# Patient Record
Sex: Female | Born: 1955
Health system: Southern US, Community
[De-identification: ages and names within clinical notes are randomized; demographics above are authoritative.]

## PROBLEM LIST (undated history)

## (undated) DIAGNOSIS — I499 Cardiac arrhythmia, unspecified: Secondary | ICD-10-CM

## (undated) DIAGNOSIS — E785 Hyperlipidemia, unspecified: Secondary | ICD-10-CM

## (undated) DIAGNOSIS — K635 Polyp of colon: Secondary | ICD-10-CM

## (undated) DIAGNOSIS — R5383 Other fatigue: Secondary | ICD-10-CM

## (undated) DIAGNOSIS — G932 Benign intracranial hypertension: Secondary | ICD-10-CM

## (undated) DIAGNOSIS — R079 Chest pain, unspecified: Secondary | ICD-10-CM

## (undated) DIAGNOSIS — F419 Anxiety disorder, unspecified: Secondary | ICD-10-CM

## (undated) DIAGNOSIS — M549 Dorsalgia, unspecified: Secondary | ICD-10-CM

## (undated) DIAGNOSIS — I10 Essential (primary) hypertension: Secondary | ICD-10-CM

## (undated) DIAGNOSIS — M1990 Unspecified osteoarthritis, unspecified site: Secondary | ICD-10-CM

## (undated) DIAGNOSIS — H93A3 Pulsatile tinnitus, bilateral: Secondary | ICD-10-CM

## (undated) HISTORY — DX: Anxiety disorder, unspecified: F41.9

## (undated) HISTORY — DX: Pulsatile tinnitus, bilateral: H93.A3

## (undated) HISTORY — DX: Benign intracranial hypertension: G93.2

## (undated) HISTORY — DX: Essential (primary) hypertension: I10

## (undated) HISTORY — PX: WISDOM TOOTH EXTRACTION: SHX21

## (undated) HISTORY — DX: Hyperlipidemia, unspecified: E78.5

## (undated) HISTORY — DX: Cardiac arrhythmia, unspecified: I49.9

## (undated) HISTORY — DX: Dorsalgia, unspecified: M54.9

## (undated) HISTORY — PX: COLONOSCOPY: SHX174

## (undated) HISTORY — DX: Unspecified osteoarthritis, unspecified site: M19.90

## (undated) HISTORY — DX: Chest pain, unspecified: R07.9

## (undated) HISTORY — DX: Polyp of colon: K63.5

## (undated) HISTORY — DX: Other fatigue: R53.83

---

## 1998-04-08 ENCOUNTER — Ambulatory Visit (HOSPITAL_COMMUNITY): Admission: RE | Admit: 1998-04-08 | Discharge: 1998-04-08 | Payer: Self-pay | Admitting: Internal Medicine

## 1998-04-08 ENCOUNTER — Encounter: Payer: Self-pay | Admitting: Internal Medicine

## 1998-04-12 ENCOUNTER — Other Ambulatory Visit: Admission: RE | Admit: 1998-04-12 | Discharge: 1998-04-12 | Payer: Self-pay | Admitting: Internal Medicine

## 1998-09-07 ENCOUNTER — Other Ambulatory Visit: Admission: RE | Admit: 1998-09-07 | Discharge: 1998-09-07 | Payer: Self-pay | Admitting: Gynecology

## 1999-08-31 ENCOUNTER — Other Ambulatory Visit: Admission: RE | Admit: 1999-08-31 | Discharge: 1999-08-31 | Payer: Self-pay | Admitting: Gynecology

## 2000-03-05 ENCOUNTER — Ambulatory Visit (HOSPITAL_BASED_OUTPATIENT_CLINIC_OR_DEPARTMENT_OTHER): Admission: RE | Admit: 2000-03-05 | Discharge: 2000-03-05 | Payer: Self-pay | Admitting: Orthopedic Surgery

## 2000-12-10 ENCOUNTER — Encounter: Payer: Self-pay | Admitting: Internal Medicine

## 2000-12-10 ENCOUNTER — Ambulatory Visit (HOSPITAL_COMMUNITY): Admission: RE | Admit: 2000-12-10 | Discharge: 2000-12-10 | Payer: Self-pay | Admitting: Internal Medicine

## 2001-02-12 ENCOUNTER — Other Ambulatory Visit: Admission: RE | Admit: 2001-02-12 | Discharge: 2001-02-12 | Payer: Self-pay | Admitting: Gynecology

## 2002-02-09 ENCOUNTER — Other Ambulatory Visit: Admission: RE | Admit: 2002-02-09 | Discharge: 2002-02-09 | Payer: Self-pay | Admitting: Gynecology

## 2003-09-27 ENCOUNTER — Ambulatory Visit (HOSPITAL_COMMUNITY): Admission: RE | Admit: 2003-09-27 | Discharge: 2003-09-27 | Payer: Self-pay | Admitting: Internal Medicine

## 2003-10-01 ENCOUNTER — Ambulatory Visit (HOSPITAL_COMMUNITY): Admission: RE | Admit: 2003-10-01 | Discharge: 2003-10-01 | Payer: Self-pay | Admitting: Internal Medicine

## 2004-07-27 ENCOUNTER — Ambulatory Visit: Payer: Self-pay | Admitting: Internal Medicine

## 2004-08-14 ENCOUNTER — Other Ambulatory Visit: Admission: RE | Admit: 2004-08-14 | Discharge: 2004-08-14 | Payer: Self-pay | Admitting: Gynecology

## 2005-02-03 ENCOUNTER — Emergency Department (HOSPITAL_COMMUNITY): Admission: EM | Admit: 2005-02-03 | Discharge: 2005-02-03 | Payer: Self-pay | Admitting: Emergency Medicine

## 2005-02-15 ENCOUNTER — Encounter: Admission: RE | Admit: 2005-02-15 | Discharge: 2005-02-15 | Payer: Self-pay | Admitting: Neurology

## 2005-09-27 ENCOUNTER — Emergency Department (HOSPITAL_COMMUNITY): Admission: EM | Admit: 2005-09-27 | Discharge: 2005-09-27 | Payer: Self-pay | Admitting: Emergency Medicine

## 2006-03-20 ENCOUNTER — Encounter: Admission: RE | Admit: 2006-03-20 | Discharge: 2006-03-20 | Payer: Self-pay | Admitting: Neurology

## 2007-03-04 ENCOUNTER — Ambulatory Visit: Payer: Self-pay | Admitting: Internal Medicine

## 2007-03-11 ENCOUNTER — Ambulatory Visit: Payer: Self-pay | Admitting: Internal Medicine

## 2008-01-09 ENCOUNTER — Emergency Department (HOSPITAL_COMMUNITY): Admission: EM | Admit: 2008-01-09 | Discharge: 2008-01-10 | Payer: Self-pay | Admitting: Emergency Medicine

## 2009-09-09 ENCOUNTER — Encounter: Admission: RE | Admit: 2009-09-09 | Discharge: 2009-09-09 | Payer: Self-pay | Admitting: Family Medicine

## 2009-09-20 ENCOUNTER — Encounter: Admission: RE | Admit: 2009-09-20 | Discharge: 2009-09-20 | Payer: Self-pay | Admitting: Family Medicine

## 2010-07-14 NOTE — Op Note (Signed)
[facility]  Patient:    [name], Nicole Cantu                         MRN: [id number] Proc. Date: 03/05/00 Attending:  Elana Alm. Thurston Hole, M.D.                           Operative Report  PREOPERATIVE DIAGNOSIS:  Left knee medial meniscus tear.  POSTOPERATIVE DIAGNOSIS: 1. Left knee medial meniscus tear. 2. Left knee medial compartment and patellofemoral grade 3 chondromalacia.  OPERATION PERFORMED: 1. Left knee examination under anesthesia followed by arthroscopic partial    medial meniscectomy. 2. Left knee chondroplasty.  SURGEON:  Elana Alm. Thurston Hole, M.D.  ASSISTANT:  Gifford Shave, PA  ANESTHESIA:  Local and MAC.  OPERATIVE TIME:  30 minutes.  COMPLICATIONS:  None.  INDICATIONS FOR PROCEDURE:  [name] is a 55 year old woman who has had three years of increasing left knee pain with loss of full motion and intermittent catching and popping, signs and symptoms consistent with medial meniscus tear, who has failed conservative care and is now to undergo arthroscopy.  DESCRIPTION OF PROCEDURE:  The patient was brought to the operating room on March 05, 2000 after a block had been placed in the holding room.  Placed on the operating table in supine position.  Her left knee was examined under anesthesia.  Range of motion from 0 to 125 degrees, 1+ crepitation.  Knee stable to ligamentous exam with normal patellar tracking.  After this was done the leg was prepped using sterile Betadine and draped using sterile technique. Originally through an inferolateral portal, the arthroscope with a pump attached was placed and through an inferomedial portal, an arthroscopic probe was placed.  On initial inspection of the medial compartment she was found to have 20% grade 3 chondromalacia, medial femoral condyle, medial tibial plateau which was debrided.  Medial meniscus was probed and she was found to have split complex tear of the posterior medial horn  of which 40% was resected back to a stable rim.  The intercondylar notch was inspected.  The anterior and posterior cruciate ligaments were normal.  The lateral compartment inspected. Articular cartilage, lateral femoral condyle and lateral tibial plateau was intact.  Lateral meniscus was probed and this was found to be normal.  The patellofemoral joint was inspected.  She had grade 3 chondromalacia over 70% of the patella which was thoroughly debrided.  Femoral groove showed only mild grade 1 to 2 changes.  Patella tracked normally.  Moderate synovitis in the medial and lateral gutters was debrided.  Otherwise they were free of pathology.  After this was done, it was felt that all pathology had been satisfactorily addressed.  The instruments were removed.  Portals were closed with 3-0 nylon suture and injected with 0.25% Marcaine with epinephrine and 5 mg of morphine.  Sterile dressing applied.  The patient was awakened and taken to the recovery room in stable condition.  FOLLOW-UP:  [name] is to be followed as an outpatient on Vicodin and Vioxx.  See her back in the office in a week for suture removal and follow-up. D:  03/05/00 TD:  03/05/00 Job: [id number]

## 2010-11-28 LAB — POCT CARDIAC MARKERS
CKMB, poc: <1 — ABNORMAL LOW
CKMB, poc: <1 — ABNORMAL LOW
Myoglobin, poc: 31.9
Myoglobin, poc: 34.1
Troponin i, poc: <0.05
Troponin i, poc: <0.05

## 2010-11-28 LAB — D-DIMER, QUANTITATIVE: D-Dimer, Quant: 0.61 — ABNORMAL HIGH

## 2010-11-28 LAB — COMPREHENSIVE METABOLIC PANEL
ALT: 27
AST: 36
Albumin: 4.2
Alkaline Phosphatase: 33 — ABNORMAL LOW
BUN: 7
CO2: 29
Calcium: 9.6
Chloride: 106
Creatinine, Ser: 0.77
GFR calc Af Amer: >60
GFR calc non Af Amer: >60
Glucose, Bld: 140 — ABNORMAL HIGH
Potassium: 3.7
Sodium: 142
Total Bilirubin: 0.7
Total Protein: 6.3

## 2010-11-28 LAB — LIPASE, BLOOD: Lipase: 21

## 2010-11-28 LAB — CBC
HCT: 39.8
Hemoglobin: 13.2
MCHC: 33.2
MCV: 94.1
Platelets: 210
RBC: 4.23
RDW: 13.2
WBC: 6.1

## 2010-11-28 LAB — DIFFERENTIAL
Basophils Absolute: 0
Basophils Relative: 0
Eosinophils Absolute: 0.2
Eosinophils Relative: 3
Lymphocytes Relative: 32
Lymphs Abs: 1.9
Monocytes Absolute: 0.4
Monocytes Relative: 7
Neutro Abs: 3.5
Neutrophils Relative %: 58

## 2011-09-18 ENCOUNTER — Other Ambulatory Visit: Payer: Self-pay | Admitting: Family Medicine

## 2011-09-18 DIAGNOSIS — R221 Localized swelling, mass and lump, neck: Secondary | ICD-10-CM

## 2011-09-19 ENCOUNTER — Ambulatory Visit
Admission: RE | Admit: 2011-09-19 | Discharge: 2011-09-19 | Disposition: A | Discharge status: Home / Self Care | Payer: 59 | Source: Ambulatory Visit | Attending: Family Medicine | Admitting: Family Medicine

## 2011-09-19 DIAGNOSIS — R221 Localized swelling, mass and lump, neck: Secondary | ICD-10-CM

## 2012-02-29 ENCOUNTER — Other Ambulatory Visit: Payer: Self-pay | Admitting: Family Medicine

## 2012-02-29 DIAGNOSIS — E042 Nontoxic multinodular goiter: Secondary | ICD-10-CM

## 2012-03-03 ENCOUNTER — Ambulatory Visit
Admission: RE | Admit: 2012-03-03 | Discharge: 2012-03-03 | Disposition: A | Discharge status: Home / Self Care | Payer: 59 | Source: Ambulatory Visit | Attending: Family Medicine | Admitting: Family Medicine

## 2012-03-03 DIAGNOSIS — E042 Nontoxic multinodular goiter: Secondary | ICD-10-CM

## 2012-03-21 ENCOUNTER — Ambulatory Visit
Admission: RE | Admit: 2012-03-21 | Discharge: 2012-03-21 | Disposition: A | Discharge status: Home / Self Care | Payer: 59 | Source: Ambulatory Visit | Attending: Family Medicine | Admitting: Family Medicine

## 2012-03-21 ENCOUNTER — Other Ambulatory Visit: Payer: Self-pay | Admitting: Family Medicine

## 2012-03-21 DIAGNOSIS — R52 Pain, unspecified: Secondary | ICD-10-CM

## 2012-12-02 ENCOUNTER — Ambulatory Visit: Payer: 59 | Attending: Family Medicine

## 2012-12-02 DIAGNOSIS — M25659 Stiffness of unspecified hip, not elsewhere classified: Secondary | ICD-10-CM | POA: Insufficient documentation

## 2012-12-02 DIAGNOSIS — R5381 Other malaise: Secondary | ICD-10-CM | POA: Insufficient documentation

## 2012-12-02 DIAGNOSIS — M545 Low back pain, unspecified: Secondary | ICD-10-CM | POA: Insufficient documentation

## 2012-12-02 DIAGNOSIS — IMO0001 Reserved for inherently not codable concepts without codable children: Secondary | ICD-10-CM | POA: Insufficient documentation

## 2012-12-03 ENCOUNTER — Ambulatory Visit: Payer: 59 | Admitting: Physical Therapy

## 2012-12-08 ENCOUNTER — Ambulatory Visit: Payer: 59 | Admitting: Physical Therapy

## 2012-12-10 ENCOUNTER — Ambulatory Visit: Payer: 59 | Admitting: Physical Therapy

## 2012-12-15 ENCOUNTER — Ambulatory Visit: Payer: 59 | Admitting: Physical Therapy

## 2012-12-17 ENCOUNTER — Ambulatory Visit: Payer: 59

## 2012-12-22 ENCOUNTER — Ambulatory Visit: Payer: 59 | Admitting: Physical Therapy

## 2012-12-24 ENCOUNTER — Ambulatory Visit: Payer: 59 | Admitting: Physical Therapy

## 2012-12-29 ENCOUNTER — Ambulatory Visit: Payer: 59 | Attending: Family Medicine | Admitting: Physical Therapy

## 2012-12-29 DIAGNOSIS — M545 Low back pain, unspecified: Secondary | ICD-10-CM | POA: Insufficient documentation

## 2012-12-29 DIAGNOSIS — M25659 Stiffness of unspecified hip, not elsewhere classified: Secondary | ICD-10-CM | POA: Insufficient documentation

## 2012-12-29 DIAGNOSIS — IMO0001 Reserved for inherently not codable concepts without codable children: Secondary | ICD-10-CM | POA: Insufficient documentation

## 2012-12-29 DIAGNOSIS — R5381 Other malaise: Secondary | ICD-10-CM | POA: Insufficient documentation

## 2012-12-31 ENCOUNTER — Ambulatory Visit: Payer: 59 | Admitting: Physical Therapy

## 2013-01-07 ENCOUNTER — Ambulatory Visit: Payer: 59 | Admitting: Physical Therapy

## 2013-01-09 ENCOUNTER — Ambulatory Visit: Payer: 59 | Admitting: Physical Therapy

## 2013-01-12 ENCOUNTER — Ambulatory Visit: Payer: 59 | Admitting: Physical Therapy

## 2013-01-14 ENCOUNTER — Ambulatory Visit: Payer: 59 | Admitting: Physical Therapy

## 2013-01-19 ENCOUNTER — Ambulatory Visit: Payer: 59

## 2013-11-27 ENCOUNTER — Encounter: Payer: Self-pay | Admitting: Internal Medicine

## 2014-01-11 ENCOUNTER — Other Ambulatory Visit: Payer: Self-pay

## 2014-01-12 LAB — CYTOLOGY - PAP

## 2016-04-20 DIAGNOSIS — H01001 Unspecified blepharitis right upper eyelid: Secondary | ICD-10-CM | POA: Diagnosis not present

## 2016-04-20 DIAGNOSIS — H01004 Unspecified blepharitis left upper eyelid: Secondary | ICD-10-CM | POA: Diagnosis not present

## 2016-11-23 DIAGNOSIS — Z1231 Encounter for screening mammogram for malignant neoplasm of breast: Secondary | ICD-10-CM | POA: Diagnosis not present

## 2016-11-23 DIAGNOSIS — Z803 Family history of malignant neoplasm of breast: Secondary | ICD-10-CM | POA: Diagnosis not present

## 2017-01-01 DIAGNOSIS — Z0000 Encounter for general adult medical examination without abnormal findings: Secondary | ICD-10-CM | POA: Diagnosis not present

## 2017-01-01 DIAGNOSIS — E785 Hyperlipidemia, unspecified: Secondary | ICD-10-CM | POA: Diagnosis not present

## 2017-01-01 DIAGNOSIS — Z23 Encounter for immunization: Secondary | ICD-10-CM | POA: Diagnosis not present

## 2017-03-05 ENCOUNTER — Ambulatory Visit
Admission: RE | Admit: 2017-03-05 | Discharge: 2017-03-05 | Disposition: A | Discharge status: Home / Self Care | Payer: 59 | Source: Ambulatory Visit | Attending: Family Medicine | Admitting: Family Medicine

## 2017-03-05 ENCOUNTER — Other Ambulatory Visit: Payer: Self-pay | Admitting: Family Medicine

## 2017-03-05 DIAGNOSIS — M25561 Pain in right knee: Secondary | ICD-10-CM

## 2017-03-05 DIAGNOSIS — S8991XA Unspecified injury of right lower leg, initial encounter: Secondary | ICD-10-CM | POA: Diagnosis not present

## 2017-03-05 DIAGNOSIS — M7989 Other specified soft tissue disorders: Secondary | ICD-10-CM | POA: Diagnosis not present

## 2017-04-03 ENCOUNTER — Encounter: Payer: Self-pay | Admitting: Gastroenterology

## 2017-04-24 DIAGNOSIS — H01001 Unspecified blepharitis right upper eyelid: Secondary | ICD-10-CM | POA: Diagnosis not present

## 2017-05-16 ENCOUNTER — Encounter: Payer: Self-pay | Admitting: Gastroenterology

## 2017-06-08 DIAGNOSIS — Z23 Encounter for immunization: Secondary | ICD-10-CM | POA: Diagnosis not present

## 2017-06-25 ENCOUNTER — Other Ambulatory Visit: Payer: Self-pay

## 2017-06-25 ENCOUNTER — Ambulatory Visit (AMBULATORY_SURGERY_CENTER): Payer: Self-pay

## 2017-06-25 VITALS — Ht 64.0 in | Wt 157.8 lb

## 2017-06-25 DIAGNOSIS — Z1211 Encounter for screening for malignant neoplasm of colon: Secondary | ICD-10-CM

## 2017-06-25 MED ORDER — NA SULFATE-K SULFATE-MG SULF 17.5-3.13-1.6 GM/177ML PO SOLN: 1.0000 | Freq: Once | ORAL | 0 refills | Status: AC

## 2017-06-25 NOTE — Progress Notes (Signed)
Denies allergies to eggs or soy products. Denies complication of anesthesia or sedation. Denies use of weight loss medication. Denies use of O2.   Emmi instructions declined.  

## 2017-06-28 ENCOUNTER — Encounter: Payer: Self-pay | Admitting: Gastroenterology

## 2017-07-05 ENCOUNTER — Telehealth: Payer: Self-pay | Admitting: Gastroenterology

## 2017-07-05 DIAGNOSIS — J209 Acute bronchitis, unspecified: Secondary | ICD-10-CM | POA: Diagnosis not present

## 2017-07-05 DIAGNOSIS — R05 Cough: Secondary | ICD-10-CM | POA: Diagnosis not present

## 2017-07-05 DIAGNOSIS — B379 Candidiasis, unspecified: Secondary | ICD-10-CM | POA: Diagnosis not present

## 2017-07-05 NOTE — Telephone Encounter (Signed)
Husband states saw urgent care today and she has been dx'd with bronchitis- she got 4 scripts today, one antibiotic and was wondering about colon thursdya 5-16. Pt's husband informed as lomg as no fever and her s/s do not get worse she can proceed with colon as scheduled 5-16.  If she worsens, runs fever she may need to cancel. She can continue abx as directed even with colon 5-16.  Cal if she worsens and needs to cancel by Tuesday  07-09-17.  Call with further questions.  Lelan Pons PV

## 2017-07-09 ENCOUNTER — Telehealth: Payer: Self-pay

## 2017-07-09 NOTE — Telephone Encounter (Signed)
Pt's husband calling wanting to speak with nurse again wants to verify it is okay for pt to have colonoscopy on 07/11/17. Best call back [phone].

## 2017-07-09 NOTE — Telephone Encounter (Signed)
Return pts husband telephone call. Pt husband verbalize pt is feeling better. No s/s of fever. Pt takes last dose of antibiotic tomorrow. Advised it is okay to proceed with colonoscopy as scheduled.

## 2017-07-11 ENCOUNTER — Other Ambulatory Visit: Payer: Self-pay

## 2017-07-11 ENCOUNTER — Ambulatory Visit (AMBULATORY_SURGERY_CENTER): Payer: 59 | Admitting: Gastroenterology

## 2017-07-11 ENCOUNTER — Encounter: Payer: Self-pay | Admitting: Gastroenterology

## 2017-07-11 VITALS — BP 111/62 | HR 59 | Temp 97.8°F | Resp 11 | Ht 64.0 in | Wt 157.0 lb

## 2017-07-11 DIAGNOSIS — D123 Benign neoplasm of transverse colon: Secondary | ICD-10-CM | POA: Diagnosis not present

## 2017-07-11 DIAGNOSIS — D122 Benign neoplasm of ascending colon: Secondary | ICD-10-CM

## 2017-07-11 DIAGNOSIS — Z1211 Encounter for screening for malignant neoplasm of colon: Secondary | ICD-10-CM | POA: Diagnosis present

## 2017-07-11 MED ORDER — SODIUM CHLORIDE 0.9 % IV SOLN: 500.0000 mL | Freq: Once | INTRAVENOUS | Status: DC

## 2017-07-11 NOTE — Progress Notes (Signed)
Called to room to assist during endoscopic procedure.  Patient ID and intended procedure confirmed with present staff. Received instructions for my participation in the procedure from the performing physician.  

## 2017-07-11 NOTE — Progress Notes (Signed)
Pt just getting over Bronchitis, finish last dose of antibiotic on Tuesday.

## 2017-07-11 NOTE — Progress Notes (Signed)
Report to PACU, RN, vss, BBS= Clear.  

## 2017-07-11 NOTE — Progress Notes (Signed)
Pt's states no medical or surgical changes since previsit or office visit.Pt's states no medical or surgical changes since previsit or office visit. 

## 2017-07-11 NOTE — Patient Instructions (Signed)
**   Handouts given on polyps and hemorrhoids  YOU HAD AN ENDOSCOPIC PROCEDURE TODAY AT THE [facility]:   Refer to the procedure report that was given to you for any specific questions about what was found during the examination.  If the procedure report does not answer your questions, please call your gastroenterologist to clarify.  If you requested that your care partner not be given the details of your procedure findings, then the procedure report has been included in a sealed envelope for you to review at your convenience later.  YOU SHOULD EXPECT: Some feelings of bloating in the abdomen. Passage of more gas than usual.  Walking can help get rid of the air that was put into your GI tract during the procedure and reduce the bloating. If you had a lower endoscopy (such as a colonoscopy or flexible sigmoidoscopy) you may notice spotting of blood in your stool or on the toilet paper. If you underwent a bowel prep for your procedure, you may not have a normal bowel movement for a few days.  Please Note:  You might notice some irritation and congestion in your nose or some drainage.  This is from the oxygen used during your procedure.  There is no need for concern and it should clear up in a day or so.  SYMPTOMS TO REPORT IMMEDIATELY:   Following lower endoscopy (colonoscopy or flexible sigmoidoscopy):  Excessive amounts of blood in the stool  Significant tenderness or worsening of abdominal pains  Swelling of the abdomen that is new, acute  Fever of 100F or higher   For urgent or emergent issues, a gastroenterologist can be reached at any hour by calling [phone].   DIET:  We do recommend a small meal at first, but then you may proceed to your regular diet.  Drink plenty of fluids but you should avoid alcoholic beverages for 24 hours.  ACTIVITY:  You should plan to take it easy for the rest of today and you should NOT DRIVE or use heavy machinery until tomorrow (because of the  sedation medicines used during the test).    FOLLOW UP: Our staff will call the number listed on your records the next business day following your procedure to check on you and address any questions or concerns that you may have regarding the information given to you following your procedure. If we do not reach you, we will leave a message.  However, if you are feeling well and you are not experiencing any problems, there is no need to return our call.  We will assume that you have returned to your regular daily activities without incident.  If any biopsies were taken you will be contacted by phone or by letter within the next 1-3 weeks.  Please call us at [phone] if you have not heard about the biopsies in 3 weeks.    SIGNATURES/CONFIDENTIALITY: You and/or your care partner have signed paperwork which will be entered into your electronic medical record.  These signatures attest to the fact that that the information above on your After Visit Summary has been reviewed and is understood.  Full responsibility of the confidentiality of this discharge information lies with you and/or your care-partner. 

## 2017-07-11 NOTE — Op Note (Signed)
[facility] Patient Name: Nicole Cantu Procedure Date: 07/11/2017 8:03 AM MRN: [id number] Endoscopist: Mauri Pole , MD Age: 62 Referring MD:  Date of Birth: 11-29-1955 Gender: Female Account #: [id number] Procedure:                Colonoscopy Indications:              Screening for colorectal malignant neoplasm, Last                            colonoscopy: 2009 Medicines:                Monitored Anesthesia Care Procedure:                Pre-Anesthesia Assessment:                           - Prior to the procedure, a History and Physical                            was performed, and patient medications and                            allergies were reviewed. The patient's tolerance of                            previous anesthesia was also reviewed. The risks                            and benefits of the procedure and the sedation                            options and risks were discussed with the patient.                            All questions were answered, and informed consent                            was obtained. Prior Anticoagulants: The patient has                            taken no previous anticoagulant or antiplatelet                            agents. ASA Grade Assessment: II - A patient with                            mild systemic disease. After reviewing the risks                            and benefits, the patient was deemed in                            satisfactory condition to undergo the procedure.  After obtaining informed consent, the colonoscope                            was passed under direct vision. Throughout the                            procedure, the patient's blood pressure, pulse, and                            oxygen saturations were monitored continuously. The                            Model PCF-[id number]) scope was introduced                            through the anus and advanced to the  the cecum,                            identified by appendiceal orifice and ileocecal                            valve. The colonoscopy was performed without                            difficulty. The patient tolerated the procedure                            well. The quality of the bowel preparation was                            excellent. The ileocecal valve, appendiceal                            orifice, and rectum were photographed. Scope In: 8:06:08 AM Scope Out: 8:18:01 AM Scope Withdrawal Time: 0 hours 9 minutes 38 seconds  Total Procedure Duration: 0 hours 11 minutes 53 seconds  Findings:                 The perianal and digital rectal examinations were                            normal.                           A 2 mm polyp was found in the transverse colon. The                            polyp was sessile. The polyp was removed with a                            cold biopsy forceps. Resection and retrieval were                            complete.  Two sessile polyps were found in the transverse                            colon and ascending colon. The polyps were 4 to 8                            mm in size. These polyps were removed with a cold                            snare. Resection and retrieval were complete.                           Non-bleeding internal hemorrhoids were found during                            retroflexion. The hemorrhoids were large.                           The exam was otherwise without abnormality. Complications:            No immediate complications. Estimated Blood Loss:     Estimated blood loss was minimal. Impression:               - One 2 mm polyp in the transverse colon, removed                            with a cold biopsy forceps. Resected and retrieved.                           - Two 4 to 8 mm polyps in the transverse colon and                            in the ascending colon, removed with a cold snare.                             Resected and retrieved.                           - Non-bleeding internal hemorrhoids.                           - The examination was otherwise normal. Recommendation:           - Patient has a contact number available for                            emergencies. The signs and symptoms of potential                            delayed complications were discussed with the                            patient. Return to normal activities tomorrow.  Written discharge instructions were provided to the                            patient.                           - Resume previous diet.                           - Continue present medications.                           - Await pathology results.                           - Repeat colonoscopy in 3 - 5 years for                            surveillance based on pathology results. Mauri Pole, MD 07/11/2017 8:24:14 AM This report has been signed electronically.

## 2017-07-12 ENCOUNTER — Telehealth: Payer: Self-pay | Admitting: *Deleted

## 2017-07-12 NOTE — Telephone Encounter (Signed)
  Follow up Call-  Call back number 07/11/2017  Post procedure Call Back phone  # [phone]  Permission to leave phone message Yes  Some recent data might be hidden     Patient questions:  Do you have a fever, pain , or abdominal swelling? No. Pain Score  0 *  Have you tolerated food without any problems? Yes.    Have you been able to return to your normal activities? Yes.    Do you have any questions about your discharge instructions: Diet   No. Medications  No. Follow up visit  No.  Do you have questions or concerns about your Care? No.  Actions: * If pain score is 4 or above: No action needed, pain <4.

## 2017-07-23 ENCOUNTER — Encounter: Payer: Self-pay | Admitting: Gastroenterology

## 2017-07-24 DIAGNOSIS — Q825 Congenital non-neoplastic nevus: Secondary | ICD-10-CM | POA: Diagnosis not present

## 2017-07-24 DIAGNOSIS — L918 Other hypertrophic disorders of the skin: Secondary | ICD-10-CM | POA: Diagnosis not present

## 2017-07-24 DIAGNOSIS — L309 Dermatitis, unspecified: Secondary | ICD-10-CM | POA: Diagnosis not present

## 2017-08-08 DIAGNOSIS — Z23 Encounter for immunization: Secondary | ICD-10-CM | POA: Diagnosis not present

## 2017-08-27 DIAGNOSIS — E785 Hyperlipidemia, unspecified: Secondary | ICD-10-CM | POA: Diagnosis not present

## 2017-11-15 DIAGNOSIS — Z23 Encounter for immunization: Secondary | ICD-10-CM | POA: Diagnosis not present

## 2017-11-25 DIAGNOSIS — Z1231 Encounter for screening mammogram for malignant neoplasm of breast: Secondary | ICD-10-CM | POA: Diagnosis not present

## 2017-12-17 ENCOUNTER — Emergency Department (HOSPITAL_COMMUNITY)
Admission: EM | Admit: 2017-12-17 | Discharge: 2017-12-17 | Disposition: A | Discharge status: Home / Self Care | Payer: 59 | Attending: Emergency Medicine | Admitting: Emergency Medicine

## 2017-12-17 ENCOUNTER — Encounter (HOSPITAL_COMMUNITY): Payer: Self-pay

## 2017-12-17 ENCOUNTER — Emergency Department (HOSPITAL_COMMUNITY): Payer: 59

## 2017-12-17 ENCOUNTER — Other Ambulatory Visit: Payer: Self-pay

## 2017-12-17 DIAGNOSIS — Z79899 Other long term (current) drug therapy: Secondary | ICD-10-CM | POA: Insufficient documentation

## 2017-12-17 DIAGNOSIS — M542 Cervicalgia: Secondary | ICD-10-CM | POA: Diagnosis not present

## 2017-12-17 DIAGNOSIS — I10 Essential (primary) hypertension: Secondary | ICD-10-CM | POA: Diagnosis not present

## 2017-12-17 DIAGNOSIS — G9389 Other specified disorders of brain: Secondary | ICD-10-CM | POA: Diagnosis not present

## 2017-12-17 DIAGNOSIS — R220 Localized swelling, mass and lump, head: Secondary | ICD-10-CM | POA: Diagnosis not present

## 2017-12-17 DIAGNOSIS — I6523 Occlusion and stenosis of bilateral carotid arteries: Secondary | ICD-10-CM | POA: Diagnosis not present

## 2017-12-17 LAB — BASIC METABOLIC PANEL
Anion gap: 7 (ref 5–15)
BUN: 11 mg/dL (ref 6–20)
CO2: 28 mmol/L (ref 22–32)
Calcium: 9.7 mg/dL (ref 8.9–10.3)
Chloride: 104 mmol/L (ref 98–111)
Creatinine, Ser: 0.89 mg/dL (ref 0.44–1.00)
GFR calc Af Amer: >60 mL/min (ref >60)
GFR calc non Af Amer: >60 mL/min (ref >60)
Glucose, Bld: 109 mg/dL — ABNORMAL HIGH (ref 70–99)
Potassium: 4.8 mmol/L (ref 3.5–5.1)
Sodium: 139 mmol/L (ref 135–145)

## 2017-12-17 LAB — CBC WITH DIFFERENTIAL/PLATELET
Abs Immature Granulocytes: 0.05 10*3/uL (ref 0.00–0.07)
Basophils Absolute: 0.1 10*3/uL (ref 0.0–0.1)
Basophils Relative: 1 %
Eosinophils Absolute: 0.2 10*3/uL (ref 0.0–0.5)
Eosinophils Relative: 2 %
HCT: 44 % (ref 36.0–46.0)
Hemoglobin: 14.4 g/dL (ref 12.0–15.0)
Immature Granulocytes: 1 %
Lymphocytes Relative: 25 %
Lymphs Abs: 2.3 10*3/uL (ref 0.7–4.0)
MCH: 30.6 pg (ref 26.0–34.0)
MCHC: 32.7 g/dL (ref 30.0–36.0)
MCV: 93.4 fL (ref 80.0–100.0)
Monocytes Absolute: 0.7 10*3/uL (ref 0.1–1.0)
Monocytes Relative: 8 %
Neutro Abs: 5.8 10*3/uL (ref 1.7–7.7)
Neutrophils Relative %: 63 %
Platelets: 238 10*3/uL (ref 150–400)
RBC: 4.71 MIL/uL (ref 3.87–5.11)
RDW: 12.6 % (ref 11.5–15.5)
WBC: 9.1 10*3/uL (ref 4.0–10.5)
nRBC: 0 % (ref 0.0–0.2)

## 2017-12-17 MED ORDER — IOPAMIDOL (ISOVUE-370) INJECTION 76%
INTRAVENOUS | Status: AC
  Administered 2017-12-17: 50 mL
  Filled 2017-12-17: qty 50

## 2017-12-17 NOTE — ED Triage Notes (Signed)
Pt states approximately 1 hour ago she had excruciating pain in the right side of her jaw radiating into her head, she reports she now has facial swelling and some numbness. Pt also endorses some dizziness.

## 2017-12-17 NOTE — Discharge Instructions (Addendum)
I suspect this was a sudden obstruction of the right parotid gland.  This is since improved.  Close follow-up with her primary care physician.  He may benefit from following back up with your ear nose and throat surgeon to make sure that vocal cord remains fully active.

## 2017-12-17 NOTE — ED Notes (Signed)
PLACED PATIENT IN THE [facility] FAMILY AT BEDSIDE

## 2017-12-17 NOTE — ED Provider Notes (Signed)
[facility]ENC[facility] DEPARTMENT Provider Note   CSN: [id number] Arrival date & time: 12/17/17  1149     History   Chief Complaint Chief Complaint  Patient presents with  . Facial Swelling    HPI Nicole Cantu is a 62 y.o. female.  HPI Patient is a 62 year old female who presents to the emergency department with complaints of sudden excruciating pain to her right face and right neck.  This was radiating up into her right head.  She developed sudden swelling of the right side of her face as well.  No difficulty breathing or swallowing.  Since arrival in the emergency department she feels as though she is had significant improvement in the swelling of the right side of her face that she continues to have mild symptoms at this time.  Denies weakness of her arms or legs.  No significant headache at this time.  She has had issues with parotid gland obstruction before in the past but this felt more sudden and severe than typical for her.   Past Medical History:  Diagnosis Date  . Anxiety   . Arrhythmia    Holter 01/26/2008-HR [id number] rare PVC .pac No adverse arrhythmia  . Back pain   . Benign intracranial hypertension    Dr Jannifer Franklin   . Chest pain    NUC stress - no ischemia  . Colon polyps   . Fatigue   . Hyperlipidemia   . Hypertension   . Pulsatile tinnitus of both ears     There are no active problems to display for this patient.   Past Surgical History:  Procedure Laterality Date  . COLONOSCOPY    . WISDOM TOOTH EXTRACTION     about 15 years ago     OB History   None      Home Medications    Prior to Admission medications   Medication Sig Start Date End Date Taking? Authorizing Provider  ALPRAZolam (XANAX) 0.25 MG tablet Take 0.25 mg by mouth 3 (three) times daily as needed for anxiety.    [provider]  benzonatate (TESSALON) 200 MG capsule  07/05/17   [provider]  lisinopril-hydrochlorothiazide  (PRINZIDE,ZESTORETIC) 10-12.5 MG per tablet Take 1 tablet by mouth daily.    [provider]  simvastatin (ZOCOR) 40 MG tablet Take 40 mg by mouth daily.    [provider]  VENTOLIN HFA 108 [phone] Base) MCG/ACT inhaler  07/05/17   [provider]    Family History Family History  Problem Relation Age of Onset  . Colon cancer Neg Hx   . Esophageal cancer Neg Hx   . Liver cancer Neg Hx   . Pancreatic cancer Neg Hx   . Rectal cancer Neg Hx   . Stomach cancer Neg Hx     Social History Social History   Tobacco Use  . Smoking status: Never Smoker  . Smokeless tobacco: Never Used  Substance Use Topics  . Alcohol use: Yes    Comment: Socially  . Drug use: Never     Allergies   Patient has no known allergies.   Review of Systems Review of Systems  All other systems reviewed and are negative.    Physical Exam Updated Vital Signs BP (!) 143/77 (BP Location: Left Arm)   Pulse 92   Temp 98 F (36.7 C) (Oral)   Resp 18   SpO2 100%   Physical Exam  Constitutional: She is oriented to person, place, and time. She appears  well-developed and well-nourished.  HENT:  Head: Normocephalic.  Normal intraoral examination.  Tolerating secretions.  Oral airway patent.  Voice normal.  No stridor.  Eyes: EOM are normal.  Neck: Normal range of motion. Neck supple. No JVD present. No tracheal deviation present. No thyromegaly present.  No carotid bruits noted.  No significant swelling of the right side of her face.  Cardiovascular: Normal rate.  Pulmonary/Chest: Effort normal.  Abdominal: She exhibits no distension.  Musculoskeletal: Normal range of motion.  Lymphadenopathy:    She has no cervical adenopathy.  Neurological: She is alert and oriented to person, place, and time.  Psychiatric: She has a normal mood and affect.  Nursing note and vitals reviewed.    ED Treatments / Results  Labs (all labs ordered are listed, but only abnormal results are  displayed) Labs Reviewed  BASIC METABOLIC PANEL - Abnormal; Notable for the following components:      Result Value   Glucose, Bld 109 (*)    All other components within normal limits  CBC WITH DIFFERENTIAL/PLATELET    EKG None  Radiology Ct Angio Head W Or Wo Contrast  Result Date: 12/17/2017 CLINICAL DATA:  62 year old female with abrupt onset excruciating pain from the right jaw radiating to the head. Dizziness. Facial swelling and numbness. EXAM: CT ANGIOGRAPHY HEAD AND NECK TECHNIQUE: Multidetector CT imaging of the head and neck was performed using the standard protocol during bolus administration of intravenous contrast. Multiplanar CT image reconstructions and MIPs were obtained to evaluate the vascular anatomy. Carotid stenosis measurements (when applicable) are obtained utilizing NASCET criteria, using the distal internal carotid diameter as the denominator. CONTRAST:  27mL ISOVUE-370 IOPAMIDOL (ISOVUE-370) INJECTION 76% COMPARISON:  Head CT without contrast 09/09/2009. FINDINGS: CT HEAD Brain: Cerebral volume remains normal for age. No midline shift, ventriculomegaly, mass effect, evidence of mass lesion, intracranial hemorrhage or evidence of cortically based acute infarction. Gray-white matter differentiation is within normal limits throughout the brain. Calvarium and skull base: Stable, negative. Paranasal sinuses: Paranasal sinuses and mastoids are stable and well pneumatized. Orbits: Visualized orbit soft tissues are within normal limits. Visualized scalp soft tissues are within normal limits. CTA NECK Skeleton: Mandible intact. Benign-appearing inferior left mandible body exostosis (series 12, image 100). No surrounding soft tissue inflammation. Normal CT appearance of the bilateral TMJ. Mild for age lower cervical spine disc and endplate degeneration. No acute osseous abnormality identified. Upper chest: Negative lung apices.  Normal superior mediastinum. Other neck: Asymmetry of  the larynx raising the possibility of right vocal cord palsy. Laryngeal and pharyngeal soft tissue contours are otherwise normal. Negative parapharyngeal, retropharyngeal, submandibular and parotid spaces. Negative thyroid. No neck mass or lymphadenopathy. Aortic arch: 3 vessel arch configuration with no significant arch atherosclerosis. Right carotid system: Normal right CCA. Normal right carotid bifurcation. Normal cervical right ICA aside from tortuosity at the C1-C2 level. Left carotid system: Normal left CCA. Normal left carotid bifurcation. Negative cervical left ICA aside from tortuosity at the C2 level (series 11, image 27). Vertebral arteries: Mild soft and calcified plaque at the right subclavian artery origin without stenosis. Normal right vertebral artery origin. Mildly tortuous right V1 segment. A small focus of gas adjacent to the right V2 segment at C5 could be intravenous, such as due to IV access. A similar focus noted adjacent to the left V2 segment at C6-C7. Alternatively, these could be related to cervical spine disc degeneration, such as small degenerative disc herniations. There is trace vacuum disc at C4-C5. Normal right vertebral  artery to the skull base. Minimal proximal left subclavian atherosclerosis. Normal left vertebral artery origin. Mildly tortuous left V1 and V2 segments. Otherwise normal left vertebral artery to the skull base. CTA HEAD Posterior circulation: Codominant distal vertebral arteries are normal to the vertebrobasilar junction. Normal PICA origins. Patent basilar artery without stenosis. Normal SCA and PCA origins. Posterior communicating arteries are diminutive or absent. Bilateral PCA branches are within normal limits. Anterior circulation: Normal right ICA siphon. Normal left ICA siphon. Patent carotid termini. Normal MCA and ACA origins. Diminutive or absent anterior communicating artery. Bilateral ACA branches are within normal limits. Left MCA M1 segment,  trifurcation, and left MCA branches are within normal limits. Right MCA M1 segment, trifurcation, and right MCA branches are within normal limits. Venous sinuses: Patent. Anatomic variants: None. Delayed phase: No abnormal enhancement identified. Review of the MIP images confirms the above findings IMPRESSION: 1. Normal CTA Head and Neck aside from tortuosity of the cervical ICAs. No dissection or stenosis. Minimal atherosclerosis. 2. Stable since 2011 and normal CT appearance of the brain. 3. No acute findings in the neck or upper chest. 4. Lower cervical spine disc and endplate degeneration. Electronically Signed   By: Genevie Ann M.D.   On: 12/17/2017 15:49   Ct Angio Neck W And/or Wo Contrast  Result Date: 12/17/2017 CLINICAL DATA:  62 year old female with abrupt onset excruciating pain from the right jaw radiating to the head. Dizziness. Facial swelling and numbness. EXAM: CT ANGIOGRAPHY HEAD AND NECK TECHNIQUE: Multidetector CT imaging of the head and neck was performed using the standard protocol during bolus administration of intravenous contrast. Multiplanar CT image reconstructions and MIPs were obtained to evaluate the vascular anatomy. Carotid stenosis measurements (when applicable) are obtained utilizing NASCET criteria, using the distal internal carotid diameter as the denominator. CONTRAST:  17mL ISOVUE-370 IOPAMIDOL (ISOVUE-370) INJECTION 76% COMPARISON:  Head CT without contrast 09/09/2009. FINDINGS: CT HEAD Brain: Cerebral volume remains normal for age. No midline shift, ventriculomegaly, mass effect, evidence of mass lesion, intracranial hemorrhage or evidence of cortically based acute infarction. Gray-white matter differentiation is within normal limits throughout the brain. Calvarium and skull base: Stable, negative. Paranasal sinuses: Paranasal sinuses and mastoids are stable and well pneumatized. Orbits: Visualized orbit soft tissues are within normal limits. Visualized scalp soft tissues are  within normal limits. CTA NECK Skeleton: Mandible intact. Benign-appearing inferior left mandible body exostosis (series 12, image 100). No surrounding soft tissue inflammation. Normal CT appearance of the bilateral TMJ. Mild for age lower cervical spine disc and endplate degeneration. No acute osseous abnormality identified. Upper chest: Negative lung apices.  Normal superior mediastinum. Other neck: Asymmetry of the larynx raising the possibility of right vocal cord palsy. Laryngeal and pharyngeal soft tissue contours are otherwise normal. Negative parapharyngeal, retropharyngeal, submandibular and parotid spaces. Negative thyroid. No neck mass or lymphadenopathy. Aortic arch: 3 vessel arch configuration with no significant arch atherosclerosis. Right carotid system: Normal right CCA. Normal right carotid bifurcation. Normal cervical right ICA aside from tortuosity at the C1-C2 level. Left carotid system: Normal left CCA. Normal left carotid bifurcation. Negative cervical left ICA aside from tortuosity at the C2 level (series 11, image 27). Vertebral arteries: Mild soft and calcified plaque at the right subclavian artery origin without stenosis. Normal right vertebral artery origin. Mildly tortuous right V1 segment. A small focus of gas adjacent to the right V2 segment at C5 could be intravenous, such as due to IV access. A similar focus noted adjacent to the left V2 segment  at C6-C7. Alternatively, these could be related to cervical spine disc degeneration, such as small degenerative disc herniations. There is trace vacuum disc at C4-C5. Normal right vertebral artery to the skull base. Minimal proximal left subclavian atherosclerosis. Normal left vertebral artery origin. Mildly tortuous left V1 and V2 segments. Otherwise normal left vertebral artery to the skull base. CTA HEAD Posterior circulation: Codominant distal vertebral arteries are normal to the vertebrobasilar junction. Normal PICA origins. Patent  basilar artery without stenosis. Normal SCA and PCA origins. Posterior communicating arteries are diminutive or absent. Bilateral PCA branches are within normal limits. Anterior circulation: Normal right ICA siphon. Normal left ICA siphon. Patent carotid termini. Normal MCA and ACA origins. Diminutive or absent anterior communicating artery. Bilateral ACA branches are within normal limits. Left MCA M1 segment, trifurcation, and left MCA branches are within normal limits. Right MCA M1 segment, trifurcation, and right MCA branches are within normal limits. Venous sinuses: Patent. Anatomic variants: None. Delayed phase: No abnormal enhancement identified. Review of the MIP images confirms the above findings IMPRESSION: 1. Normal CTA Head and Neck aside from tortuosity of the cervical ICAs. No dissection or stenosis. Minimal atherosclerosis. 2. Stable since 2011 and normal CT appearance of the brain. 3. No acute findings in the neck or upper chest. 4. Lower cervical spine disc and endplate degeneration. Electronically Signed   By: Genevie Ann M.D.   On: 12/17/2017 15:49    Procedures Procedures (including critical care time)  Medications Ordered in ED Medications  iopamidol (ISOVUE-370) 76 % injection (50 mLs  Contrast Given 12/17/17 1515)     Initial Impression / Assessment and Plan / ED Course  I have reviewed the triage vital signs and the nursing notes.  Pertinent labs & imaging results that were available during my care of the patient were reviewed by me and considered in my medical decision making (see chart for details).     CT imaging is reassuring at this time.  She is had resolution of the right-sided facial swelling.  I suspect this was sudden obstruction of her right parotid gland that has since resolved.  CT imaging of the right carotid is otherwise normal.  She was noted to have possible paralysis of 1 of her vocal cords.  She does report that over the past several years her voice is seemed  somewhat deeper to her.  She was evaluated by an ENT at one point and underwent nasopharyngoscopy in the office and reports that everything was normal.  I recommended that she contact her ENT for possible repeat nasopharyngoscopy to make sure that everything remains normal and that she has fully functional vocal cords.  Patient encouraged to return to the emergency department for new or worsening symptoms.  Stable for discharge  Final Clinical Impressions(s) / ED Diagnoses   Final diagnoses:  Acute neck pain  Facial swelling    ED Discharge Orders    None       Jola Schmidt, MD 12/17/17 1625

## 2017-12-17 NOTE — ED Provider Notes (Signed)
Patient placed in Quick Look pathway, seen and evaluated   Chief Complaint: facial pain and swelling  HPI:   55 YOF presents today with complaints of right sided facial swelling. She reports 1 hour prior to evaluation she experienced acute onset severe right sided neck pain that radiated up into her face with associated numbness and tingling. She denies any other neuro deficits. She reports the swelling has improved. She reports swelling to the left side of the face in the past and was diagnosed with salivary stone.   ROS: facial swelling (one)  Physical Exam:   Gen: No distress  Neuro: Awake and Alert  Skin: Warm    Focused Exam: minor swelling to the right angle of the mandible- no swelling noted to the neck- minimal TTP of proximal right neck at the SCM. No neuro deficits noted.      Initiation of care has begun. The patient has been counseled on the process, plan, and necessity for staying for the completion/evaluation, and the remainder of the medical screening examination   Francee Gentile 12/17/17 Cameron Proud, MD 12/17/17 2315

## 2017-12-17 NOTE — ED Notes (Signed)
Patient verbalizes understanding of discharge instructions. Opportunity for questioning and answers were provided. Ambulatory at discharge in NAD.  

## 2017-12-17 NOTE — ED Notes (Signed)
Patient transported to CT 

## 2018-02-04 ENCOUNTER — Other Ambulatory Visit: Payer: Self-pay | Admitting: Family Medicine

## 2018-02-04 ENCOUNTER — Other Ambulatory Visit (HOSPITAL_COMMUNITY)
Admission: RE | Admit: 2018-02-04 | Discharge: 2018-02-04 | Disposition: A | Discharge status: Home / Self Care | Payer: 59 | Source: Ambulatory Visit | Attending: Family Medicine | Admitting: Family Medicine

## 2018-02-04 DIAGNOSIS — Z01411 Encounter for gynecological examination (general) (routine) with abnormal findings: Secondary | ICD-10-CM | POA: Diagnosis not present

## 2018-02-04 DIAGNOSIS — Z0000 Encounter for general adult medical examination without abnormal findings: Secondary | ICD-10-CM | POA: Diagnosis not present

## 2018-02-04 DIAGNOSIS — E785 Hyperlipidemia, unspecified: Secondary | ICD-10-CM | POA: Diagnosis not present

## 2018-02-06 LAB — CYTOLOGY - PAP
Diagnosis: NEGATIVE
HPV: NOT DETECTED

## 2018-04-28 DIAGNOSIS — G932 Benign intracranial hypertension: Secondary | ICD-10-CM | POA: Diagnosis not present

## 2018-04-28 DIAGNOSIS — H43813 Vitreous degeneration, bilateral: Secondary | ICD-10-CM | POA: Diagnosis not present

## 2018-12-25 IMAGING — CT CT ANGIO HEAD
1 of 12 series · 5 of 47 positions shown · IV contrast (iopamidol)
Comparison: Head CT without contrast 09/09/2009.

ADDENDUM:
This study's findings, including the suspicion of right vocal cord
palsy but no causative lesion identified, were discussed by
telephone with Dr. Eric-Parfait on 12/17/2017 at 8782 hours.
CLINICAL DATA: 62-year-old female with abrupt onset excruciating
pain from the right jaw radiating to the head. Dizziness. Facial
swelling and numbness.

EXAM:
CT ANGIOGRAPHY HEAD AND NECK
TECHNIQUE: Multidetector CT imaging of the head and neck was performed using
the standard protocol during bolus administration of intravenous
contrast. Multiplanar CT image reconstructions and MIPs were
obtained to evaluate the vascular anatomy. Carotid stenosis
measurements (when applicable) are obtained utilizing NASCET
criteria, using the distal internal carotid diameter as the
denominator.
CONTRAST:  50mL AOV5I6-9NT IOPAMIDOL (AOV5I6-9NT) INJECTION 76%

[Series 7: carotid/brain 2.0 i30f 3 · axial · 0.44mm/px · z∈[-276,-52]mm · 5 of 170 slices shown]
[im 29/170  brain]
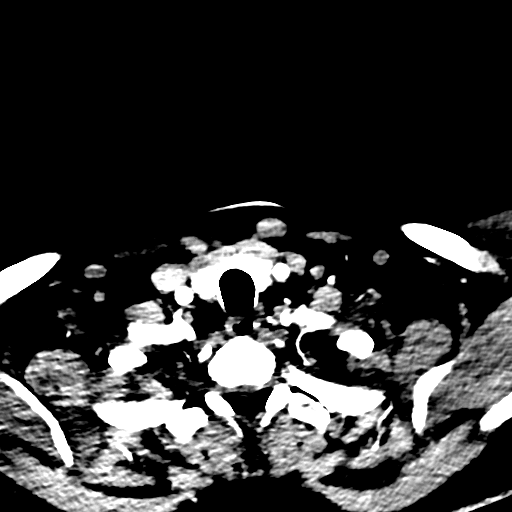
[im 57/170  bone]
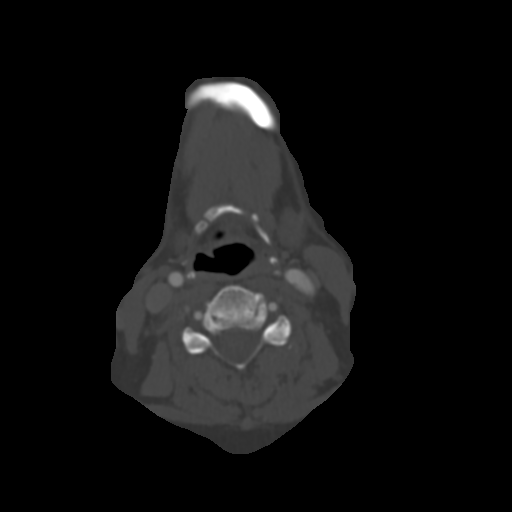
[im 85/170  brain]
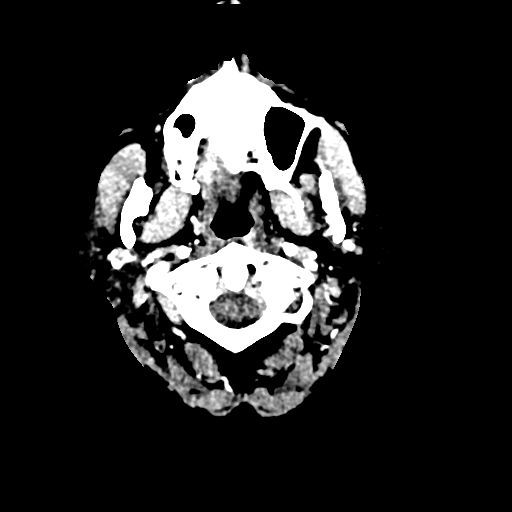
[im 113/170  bone]
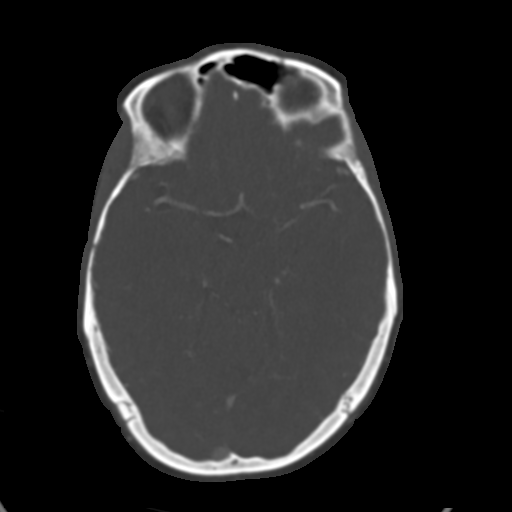
[im 141/170  brain]
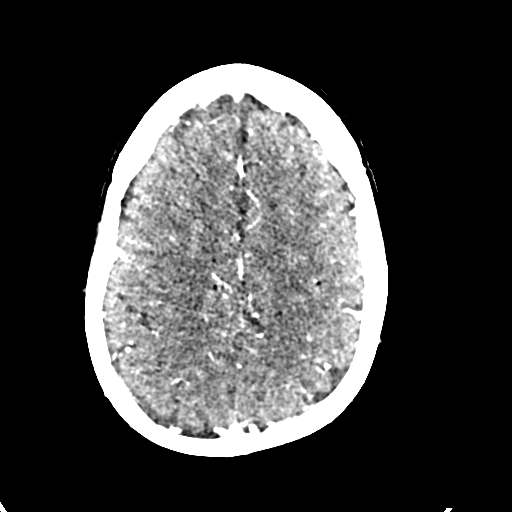

[5 of 47 positions shown; findings below may reference images not displayed]

FINDINGS: CT HEAD

Brain: Cerebral volume remains normal for age. No midline shift,
ventriculomegaly, mass effect, evidence of mass lesion, intracranial
hemorrhage or evidence of cortically based acute infarction.
Gray-white matter differentiation is within normal limits throughout
the brain.

Calvarium and skull base: Stable, negative.

Paranasal sinuses: Paranasal sinuses and mastoids are stable and
well pneumatized.

Orbits: Visualized orbit soft tissues are within normal limits.
Visualized scalp soft tissues are within normal limits.

CTA NECK

Skeleton: Mandible intact. Benign-appearing inferior left mandible
body exostosis (series 12, image 100). No surrounding soft tissue
inflammation. Normal CT appearance of the bilateral TMJ. Mild for
age lower cervical spine disc and endplate degeneration. No acute
osseous abnormality identified.

Upper chest: Negative lung apices.  Normal superior mediastinum.

Other neck: Asymmetry of the larynx raising the possibility of right
vocal cord palsy. Laryngeal and pharyngeal soft tissue contours are
otherwise normal. Negative parapharyngeal, retropharyngeal,
submandibular and parotid spaces. Negative thyroid. No neck mass or
lymphadenopathy.

Aortic arch: 3 vessel arch configuration with no significant arch
atherosclerosis.

Right carotid system: Normal right CCA. Normal right carotid
bifurcation. Normal cervical right ICA aside from tortuosity at the
C1-C2 level.

Left carotid system: Normal left CCA. Normal left carotid
bifurcation. Negative cervical left ICA aside from tortuosity at the
C2 level (series 11, image 27).

Vertebral arteries:
Mild soft and calcified plaque at the right subclavian artery origin
without stenosis. Normal right vertebral artery origin. Mildly
tortuous right V1 segment. A small focus of gas adjacent to the
right V2 segment at C5 could be intravenous, such as due to IV
access. A similar focus noted adjacent to the left V2 segment at
C6-C7. Alternatively, these could be related to cervical spine disc
degeneration, such as small degenerative disc herniations. There is
trace vacuum disc at C4-C5.

Normal right vertebral artery to the skull base.

Minimal proximal left subclavian atherosclerosis. Normal left
vertebral artery origin. Mildly tortuous left V1 and V2 segments.
Otherwise normal left vertebral artery to the skull base.

CTA HEAD

Posterior circulation: Codominant distal vertebral arteries are
normal to the vertebrobasilar junction. Normal PICA origins. Patent
basilar artery without stenosis. Normal SCA and PCA origins.
Posterior communicating arteries are diminutive or absent. Bilateral
PCA branches are within normal limits.

Anterior circulation: Normal right ICA siphon. Normal left ICA
siphon. Patent carotid termini. Normal MCA and ACA origins.
Diminutive or absent anterior communicating artery. Bilateral ACA
branches are within normal limits. Left MCA M1 segment,
trifurcation, and left MCA branches are within normal limits. Right
MCA M1 segment, trifurcation, and right MCA branches are within
normal limits.

Venous sinuses: Patent.

Anatomic variants: None.

Delayed phase: No abnormal enhancement identified.

Review of the MIP images confirms the above findings
IMPRESSION: 1. Normal CTA Head and Neck aside from tortuosity of the cervical
ICAs. No dissection or stenosis. Minimal atherosclerosis.
2. Stable since 7644 and normal CT appearance of the brain.
3. No acute findings in the neck or upper chest.
4. Lower cervical spine disc and endplate degeneration.

## 2019-03-31 ENCOUNTER — Ambulatory Visit
Admission: RE | Admit: 2019-03-31 | Discharge: 2019-03-31 | Disposition: A | Discharge status: Home / Self Care | Payer: 59 | Source: Ambulatory Visit | Attending: Family Medicine | Admitting: Family Medicine

## 2019-03-31 ENCOUNTER — Other Ambulatory Visit: Payer: Self-pay | Admitting: Family Medicine

## 2019-03-31 DIAGNOSIS — M1990 Unspecified osteoarthritis, unspecified site: Secondary | ICD-10-CM

## 2020-05-20 ENCOUNTER — Encounter (HOSPITAL_COMMUNITY): Payer: Self-pay | Admitting: Emergency Medicine

## 2020-05-20 ENCOUNTER — Emergency Department (HOSPITAL_COMMUNITY)
Admission: EM | Admit: 2020-05-20 | Discharge: 2020-05-20 | Disposition: A | Discharge status: Home / Self Care | Payer: 59 | Attending: Emergency Medicine | Admitting: Emergency Medicine

## 2020-05-20 DIAGNOSIS — I10 Essential (primary) hypertension: Secondary | ICD-10-CM | POA: Insufficient documentation

## 2020-05-20 DIAGNOSIS — Z2914 Encounter for prophylactic rabies immune globin: Secondary | ICD-10-CM | POA: Insufficient documentation

## 2020-05-20 DIAGNOSIS — Z203 Contact with and (suspected) exposure to rabies: Secondary | ICD-10-CM | POA: Diagnosis not present

## 2020-05-20 DIAGNOSIS — Z79899 Other long term (current) drug therapy: Secondary | ICD-10-CM | POA: Diagnosis not present

## 2020-05-20 DIAGNOSIS — Z23 Encounter for immunization: Secondary | ICD-10-CM | POA: Diagnosis not present

## 2020-05-20 MED ORDER — RABIES IMMUNE GLOBULIN 150 UNIT/ML IM INJ
20.0000 [IU]/kg | INJECTION | Freq: Once | INTRAMUSCULAR | Status: AC
  Administered 2020-05-20: 1425 [IU] via INTRAMUSCULAR
  Filled 2020-05-20: qty 10

## 2020-05-20 MED ORDER — RABIES VACCINE, PCEC IM SUSR
1.0000 mL | Freq: Once | INTRAMUSCULAR | Status: AC
  Administered 2020-05-20: 1 mL via INTRAMUSCULAR
  Filled 2020-05-20: qty 1

## 2020-05-20 NOTE — ED Triage Notes (Signed)
[facility] a bat that was found in her home yesterday tested positive for rabies. States she does not think she was bitten anywhere. Patient alert, oriented, and in no apparent distress at this time.

## 2020-05-20 NOTE — ED Provider Notes (Signed)
[facility] EMERGENCY DEPARTMENT Provider Note   CSN: [id number] Arrival date & time: 05/20/20  1339     History Chief Complaint  Patient presents with  . Possible Rabies Exposure    Nicole Cantu is a 65 y.o. female presenting for evaluation of possible rabies exposure.  Patient states yesterday about was found in her house.  Her and her husband Today, it was brought to animal control.  That was positive for rabies, as such, they were recommended to get the rabies vaccines.  They have never had this before.  No known bite, scratch, or injury.  She reports no significant medical problems.  She does feel very anxious that she is in the ED, which is normal for her when she needs healthcare.  No fevers, chills, chest pain.  HPI     Past Medical History:  Diagnosis Date  . Anxiety   . Arrhythmia    Holter 01/26/2008-HR [id number].AVG85 rare PVC .pac No adverse arrhythmia  . Back pain   . Benign intracranial hypertension    Dr Jannifer Franklin   . Chest pain    NUC stress - no ischemia  . Colon polyps   . Fatigue   . Hyperlipidemia   . Hypertension   . Pulsatile tinnitus of both ears     There are no problems to display for this patient.   Past Surgical History:  Procedure Laterality Date  . COLONOSCOPY    . WISDOM TOOTH EXTRACTION     about 15 years ago     OB History   No obstetric history on file.     Family History  Problem Relation Age of Onset  . Colon cancer Neg Hx   . Esophageal cancer Neg Hx   . Liver cancer Neg Hx   . Pancreatic cancer Neg Hx   . Rectal cancer Neg Hx   . Stomach cancer Neg Hx     Social History   Tobacco Use  . Smoking status: Never Smoker  . Smokeless tobacco: Never Used  Substance Use Topics  . Alcohol use: Yes    Comment: Socially  . Drug use: Never    Home Medications Prior to Admission medications   Medication Sig Start Date End Date Taking? Authorizing Provider  ALPRAZolam (XANAX) 0.25 MG tablet Take 0.25  mg by mouth 3 (three) times daily as needed for anxiety.    [provider]  benzonatate (TESSALON) 200 MG capsule  07/05/17   [provider]  lisinopril-hydrochlorothiazide (PRINZIDE,ZESTORETIC) 10-12.5 MG per tablet Take 1 tablet by mouth daily.    [provider]  simvastatin (ZOCOR) 40 MG tablet Take 40 mg by mouth daily.    [provider]  VENTOLIN HFA 108 [phone] Base) MCG/ACT inhaler  07/05/17   [provider]    Allergies    Patient has no known allergies.  Review of Systems   Review of Systems  Skin: Negative for wound.  Psychiatric/Behavioral: The patient is nervous/anxious.     Physical Exam Updated Vital Signs BP (!) 165/102 (BP Location: Right Arm)   Pulse (!) 126   Temp 98.7 F (37.1 C)   Resp 18   Wt 70.3 kg   SpO2 98%   BMI 26.61 kg/m   Physical Exam Vitals and nursing note reviewed.  Constitutional:      General: She is not in acute distress.    Appearance: She is well-developed.  HENT:     Head: Normocephalic and atraumatic.  Cardiovascular:  Rate and Rhythm: Regular rhythm. Tachycardia present.     Pulses: Normal pulses.  Pulmonary:     Effort: Pulmonary effort is normal.     Breath sounds: Normal breath sounds.  Abdominal:     General: There is no distension.     Palpations: Abdomen is soft.     Tenderness: There is no abdominal tenderness. There is no guarding.  Musculoskeletal:        General: Normal range of motion.     Cervical back: Normal range of motion.  Skin:    General: Skin is warm.     Capillary Refill: Capillary refill takes less than 2 seconds.     Findings: No rash.     Comments: No obvious wound  Neurological:     Mental Status: She is alert and oriented to person, place, and time.  Psychiatric:        Mood and Affect: Mood is anxious.     ED Results / Procedures / Treatments   Labs (all labs ordered are listed, but only abnormal results are displayed) Labs Reviewed - No  data to display  EKG None  Radiology No results found.  Procedures Procedures   Medications Ordered in ED Medications  rabies immune globulin (HYPERAB/KEDRAB) injection 1,425 Units (has no administration in time range)  rabies vaccine (RABAVERT) injection 1 mL (has no administration in time range)    ED Course  I have reviewed the triage vital signs and the nursing notes.  Pertinent labs & imaging results that were available during my care of the patient were reviewed by me and considered in my medical decision making (see chart for details).    MDM Rules/Calculators/A&P                          Patient presenting for possible rabies exposure.  On exam, patient is nontoxic.  She does appear anxious, she is mildly tachycardic likely due to anxiety.  No known bite or scratch, however patient did have a rabies positive bat in her house.  As such, will give rabies vaccine.  Discussed scheduling her remaining shots.  At this time, patient appears safe for d/c. Return precautions given.  Patient states she understands and agrees to plan.  Final Clinical Impression(s) / ED Diagnoses Final diagnoses:  Need for post exposure prophylaxis for rabies    Rx / DC Orders ED Discharge Orders    None       Franchot Heidelberg, PA-C 05/20/20 1442    Breck Coons, MD 05/23/20 1452

## 2020-05-20 NOTE — Discharge Instructions (Addendum)
Make sure you get your remaining rabies vaccines. Return to the emergency room with any new or worsening, concerning symptoms.

## 2020-05-23 ENCOUNTER — Other Ambulatory Visit: Payer: Self-pay

## 2020-05-23 ENCOUNTER — Ambulatory Visit (HOSPITAL_COMMUNITY)
Admission: RE | Admit: 2020-05-23 | Discharge: 2020-05-23 | Disposition: A | Discharge status: Home / Self Care | Payer: 59 | Source: Ambulatory Visit | Attending: Internal Medicine | Admitting: Internal Medicine

## 2020-05-23 DIAGNOSIS — Z23 Encounter for immunization: Secondary | ICD-10-CM | POA: Diagnosis not present

## 2020-05-23 DIAGNOSIS — Z203 Contact with and (suspected) exposure to rabies: Secondary | ICD-10-CM | POA: Diagnosis not present

## 2020-05-23 MED ORDER — RABIES VACCINE, PCEC IM SUSR
INTRAMUSCULAR | Status: AC
  Filled 2020-05-23: qty 2

## 2020-05-23 MED ORDER — RABIES VACCINE, PCEC IM SUSR
1.0000 mL | Freq: Once | INTRAMUSCULAR | Status: AC
  Administered 2020-05-23: 1 mL via INTRAMUSCULAR

## 2020-05-23 NOTE — ED Triage Notes (Signed)
Pt presents for second rabies vaccine with no complaints.

## 2020-05-27 ENCOUNTER — Ambulatory Visit (HOSPITAL_COMMUNITY)
Admission: RE | Admit: 2020-05-27 | Discharge: 2020-05-27 | Disposition: A | Discharge status: Home / Self Care | Payer: 59 | Source: Ambulatory Visit | Attending: Internal Medicine | Admitting: Internal Medicine

## 2020-05-27 ENCOUNTER — Other Ambulatory Visit: Payer: Self-pay

## 2020-05-27 DIAGNOSIS — Z23 Encounter for immunization: Secondary | ICD-10-CM

## 2020-05-27 DIAGNOSIS — Z203 Contact with and (suspected) exposure to rabies: Secondary | ICD-10-CM

## 2020-05-27 MED ORDER — RABIES VACCINE, PCEC IM SUSR
INTRAMUSCULAR | Status: AC
  Filled 2020-05-27: qty 1

## 2020-05-27 MED ORDER — RABIES VACCINE, PCEC IM SUSR
1.0000 mL | Freq: Once | INTRAMUSCULAR | Status: AC
  Administered 2020-05-27: 1 mL via INTRAMUSCULAR

## 2020-05-27 NOTE — ED Triage Notes (Signed)
Pt presents for 3rd rabies injection.

## 2020-06-03 ENCOUNTER — Ambulatory Visit (HOSPITAL_COMMUNITY)
Admission: EM | Admit: 2020-06-03 | Discharge: 2020-06-03 | Disposition: A | Discharge status: Home / Self Care | Payer: 59 | Attending: Internal Medicine | Admitting: Internal Medicine

## 2020-06-03 ENCOUNTER — Encounter (HOSPITAL_COMMUNITY): Payer: Self-pay | Admitting: Emergency Medicine

## 2020-06-03 ENCOUNTER — Other Ambulatory Visit: Payer: Self-pay

## 2020-06-03 DIAGNOSIS — Z203 Contact with and (suspected) exposure to rabies: Secondary | ICD-10-CM | POA: Diagnosis not present

## 2020-06-03 DIAGNOSIS — Z23 Encounter for immunization: Secondary | ICD-10-CM

## 2020-06-03 MED ORDER — RABIES VACCINE, PCEC IM SUSR
1.0000 mL | Freq: Once | INTRAMUSCULAR | Status: AC
  Administered 2020-06-03: 1 mL via INTRAMUSCULAR

## 2020-06-03 MED ORDER — RABIES VACCINE, PCEC IM SUSR
INTRAMUSCULAR | Status: AC
  Filled 2020-06-03: qty 1

## 2020-06-03 NOTE — ED Triage Notes (Signed)
Pt presents for last rabies injection, denies any complaints.

## 2020-07-02 ENCOUNTER — Emergency Department (HOSPITAL_COMMUNITY)
Admission: EM | Admit: 2020-07-02 | Discharge: 2020-07-02 | Disposition: A | Discharge status: Home / Self Care | Payer: 59 | Attending: Emergency Medicine | Admitting: Emergency Medicine

## 2020-07-02 ENCOUNTER — Emergency Department (HOSPITAL_COMMUNITY): Payer: 59

## 2020-07-02 ENCOUNTER — Other Ambulatory Visit: Payer: Self-pay

## 2020-07-02 DIAGNOSIS — Z79899 Other long term (current) drug therapy: Secondary | ICD-10-CM | POA: Diagnosis not present

## 2020-07-02 DIAGNOSIS — R42 Dizziness and giddiness: Secondary | ICD-10-CM | POA: Diagnosis present

## 2020-07-02 DIAGNOSIS — Z86018 Personal history of other benign neoplasm: Secondary | ICD-10-CM | POA: Diagnosis not present

## 2020-07-02 DIAGNOSIS — H8110 Benign paroxysmal vertigo, unspecified ear: Secondary | ICD-10-CM | POA: Insufficient documentation

## 2020-07-02 DIAGNOSIS — I10 Essential (primary) hypertension: Secondary | ICD-10-CM | POA: Insufficient documentation

## 2020-07-02 LAB — CBC
HCT: 43.5 % (ref 36.0–46.0)
Hemoglobin: 14 g/dL (ref 12.0–15.0)
MCH: 30.8 pg (ref 26.0–34.0)
MCHC: 32.2 g/dL (ref 30.0–36.0)
MCV: 95.8 fL (ref 80.0–100.0)
Platelets: 234 10*3/uL (ref 150–400)
RBC: 4.54 MIL/uL (ref 3.87–5.11)
RDW: 12.8 % (ref 11.5–15.5)
WBC: 8.2 10*3/uL (ref 4.0–10.5)
nRBC: 0 % (ref 0.0–0.2)

## 2020-07-02 LAB — DIFFERENTIAL
Abs Immature Granulocytes: 0.06 10*3/uL (ref 0.00–0.07)
Basophils Absolute: 0 10*3/uL (ref 0.0–0.1)
Basophils Relative: 1 %
Eosinophils Absolute: 0.1 10*3/uL (ref 0.0–0.5)
Eosinophils Relative: 1 %
Immature Granulocytes: 1 %
Lymphocytes Relative: 19 %
Lymphs Abs: 1.6 10*3/uL (ref 0.7–4.0)
Monocytes Absolute: 0.5 10*3/uL (ref 0.1–1.0)
Monocytes Relative: 7 %
Neutro Abs: 5.9 10*3/uL (ref 1.7–7.7)
Neutrophils Relative %: 71 %

## 2020-07-02 LAB — COMPREHENSIVE METABOLIC PANEL
ALT: 27 U/L (ref 0–44)
AST: 29 U/L (ref 15–41)
Albumin: 4.3 g/dL (ref 3.5–5.0)
Alkaline Phosphatase: 30 U/L — ABNORMAL LOW (ref 38–126)
Anion gap: 8 (ref 5–15)
BUN: 9 mg/dL (ref 8–23)
CO2: 29 mmol/L (ref 22–32)
Calcium: 9.6 mg/dL (ref 8.9–10.3)
Chloride: 101 mmol/L (ref 98–111)
Creatinine, Ser: 0.84 mg/dL (ref 0.44–1.00)
GFR, Estimated: >60 mL/min (ref >60)
Glucose, Bld: 125 mg/dL — ABNORMAL HIGH (ref 70–99)
Potassium: 4.8 mmol/L (ref 3.5–5.1)
Sodium: 138 mmol/L (ref 135–145)
Total Bilirubin: 1.1 mg/dL (ref 0.3–1.2)
Total Protein: 6.8 g/dL (ref 6.5–8.1)

## 2020-07-02 LAB — I-STAT CHEM 8, ED
BUN: 10 mg/dL (ref 8–23)
Calcium, Ion: 1.24 mmol/L (ref 1.15–1.40)
Chloride: 102 mmol/L (ref 98–111)
Creatinine, Ser: 0.8 mg/dL (ref 0.44–1.00)
Glucose, Bld: 125 mg/dL — ABNORMAL HIGH (ref 70–99)
HCT: 41 % (ref 36.0–46.0)
Hemoglobin: 13.9 g/dL (ref 12.0–15.0)
Potassium: 4.4 mmol/L (ref 3.5–5.1)
Sodium: 141 mmol/L (ref 135–145)
TCO2: 28 mmol/L (ref 22–32)

## 2020-07-02 LAB — PROTIME-INR
INR: 0.9 (ref 0.8–1.2)
Prothrombin Time: 12.5 seconds (ref 11.4–15.2)

## 2020-07-02 LAB — APTT: aPTT: 28 seconds (ref 24–36)

## 2020-07-02 LAB — CBG MONITORING, ED: Glucose-Capillary: 129 mg/dL — ABNORMAL HIGH (ref 70–99)

## 2020-07-02 MED ORDER — LORAZEPAM 2 MG/ML IJ SOLN
2.0000 mg | Freq: Once | INTRAMUSCULAR | Status: AC
  Administered 2020-07-02: 2 mg via INTRAVENOUS
  Filled 2020-07-02: qty 1

## 2020-07-02 MED ORDER — MECLIZINE HCL 25 MG PO TABS: 25.0000 mg | ORAL_TABLET | Freq: Three times a day (TID) | ORAL | 0 refills | Status: DC | PRN

## 2020-07-02 MED ORDER — SODIUM CHLORIDE 0.9% FLUSH
3.0000 mL | Freq: Once | INTRAVENOUS | Status: AC
  Administered 2020-07-02: 3 mL via INTRAVENOUS

## 2020-07-02 MED ORDER — DIAZEPAM 5 MG PO TABS: 5.0000 mg | ORAL_TABLET | Freq: Four times a day (QID) | ORAL | 0 refills | Status: DC | PRN

## 2020-07-02 NOTE — ED Provider Notes (Signed)
[facility] EMERGENCY DEPARTMENT Provider Note   CSN: [id number] Arrival date & time: 07/02/20  1016     History Chief Complaint  Patient presents with  . Stroke Symptoms    Nicole Cantu is a 65 y.o. female.  HPI She presents for evaluation of dizziness, which started last night around 9 PM and is persistent.  She describes the dizziness as a sensation that her "body is spinning, but not the room."  This is aggravated by head movement and attempts at ambulation.  She denies headache.  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  She has history of benign intracranial hypertension and pituitary tumor.  These are remote problems for which she does not actively receive interventions or treatment.  She is also recently bothered by a "frozen shoulder."  She is doing some therapy and taking diclofenac for it.  She denies fever, chills, nausea, vomiting, blurred vision, double vision, chest pain, shortness of breath, focal weakness or paresthesia.  There are no other known active modifying factors.    Past Medical History:  Diagnosis Date  . Anxiety   . Arrhythmia    Holter 01/26/2008-HR 50-85-145.AVG85 rare PVC .pac No adverse arrhythmia  . Back pain   . Benign intracranial hypertension    Dr Jannifer Franklin   . Chest pain    NUC stress - no ischemia  . Colon polyps   . Fatigue   . Hyperlipidemia   . Hypertension   . Pulsatile tinnitus of both ears     There are no problems to display for this patient.   Past Surgical History:  Procedure Laterality Date  . COLONOSCOPY    . WISDOM TOOTH EXTRACTION     about 15 years ago     OB History   No obstetric history on file.     Family History  Problem Relation Age of Onset  . Colon cancer Neg Hx   . Esophageal cancer Neg Hx   . Liver cancer Neg Hx   . Pancreatic cancer Neg Hx   . Rectal cancer Neg Hx   . Stomach cancer Neg Hx     Social History   Tobacco Use  . Smoking status: Never Smoker  . Smokeless  tobacco: Never Used  Substance Use Topics  . Alcohol use: Yes    Comment: Socially  . Drug use: Never    Home Medications Prior to Admission medications   Medication Sig Start Date End Date Taking? Authorizing Provider  diazepam (VALIUM) 5 MG tablet Take 1 tablet (5 mg total) by mouth every 6 (six) hours as needed (Vertigo). 07/02/20  Yes Daleen Bo, MD  meclizine (ANTIVERT) 25 MG tablet Take 1 tablet (25 mg total) by mouth 3 (three) times daily as needed for dizziness. 07/02/20  Yes Daleen Bo, MD  ALPRAZolam Duanne Moron) 0.25 MG tablet Take 0.25 mg by mouth 3 (three) times daily as needed for anxiety.    [provider]  benzonatate (TESSALON) 200 MG capsule  07/05/17   [provider]  lisinopril-hydrochlorothiazide (PRINZIDE,ZESTORETIC) 10-12.5 MG per tablet Take 1 tablet by mouth daily.    [provider]  simvastatin (ZOCOR) 40 MG tablet Take 40 mg by mouth daily.    [provider]  VENTOLIN HFA 108 [phone] Base) MCG/ACT inhaler  07/05/17   [provider]    Allergies    Patient has no known allergies.  Review of Systems   Review of Systems  All other systems reviewed and  are negative.   Physical Exam Updated Vital Signs BP 126/79 (BP Location: Left Arm)   Pulse 88   Temp 97.9 F (36.6 C)   Resp 16   Ht 5\' 4"  (1.626 m)   Wt 70.3 kg   SpO2 98%   BMI 26.60 kg/m   Physical Exam Vitals and nursing note reviewed.  Constitutional:      Appearance: She is well-developed.  HENT:     Head: Normocephalic and atraumatic.     Right Ear: External ear normal.     Left Ear: External ear normal.     Mouth/Throat:     Mouth: Mucous membranes are moist.     Pharynx: No oropharyngeal exudate or posterior oropharyngeal erythema.  Eyes:     Conjunctiva/sclera: Conjunctivae normal.     Pupils: Pupils are equal, round, and reactive to light.  Neck:     Trachea: Phonation normal.  Cardiovascular:     Rate and Rhythm: Normal rate and  regular rhythm.     Heart sounds: Normal heart sounds.  Pulmonary:     Effort: Pulmonary effort is normal.     Breath sounds: Normal breath sounds.  Abdominal:     General: There is no distension.     Palpations: Abdomen is soft.     Tenderness: There is no abdominal tenderness.  Musculoskeletal:        General: Normal range of motion.     Cervical back: Normal range of motion and neck supple.  Skin:    General: Skin is warm and dry.  Neurological:     Mental Status: She is alert and oriented to person, place, and time.     Cranial Nerves: No cranial nerve deficit.     Sensory: No sensory deficit.     Motor: No abnormal muscle tone.     Coordination: Coordination normal.     Comments: No dysarthria or aphasia or nystagmus.  No pronator drift.  Romberg negative.  Psychiatric:        Mood and Affect: Mood normal.        Behavior: Behavior normal.        Thought Content: Thought content normal.        Judgment: Judgment normal.     ED Results / Procedures / Treatments   Labs (all labs ordered are listed, but only abnormal results are displayed) Labs Reviewed  COMPREHENSIVE METABOLIC PANEL - Abnormal; Notable for the following components:      Result Value   Glucose, Bld 125 (*)    Alkaline Phosphatase 30 (*)    All other components within normal limits  I-STAT CHEM 8, ED - Abnormal; Notable for the following components:   Glucose, Bld 125 (*)    All other components within normal limits  CBG MONITORING, ED - Abnormal; Notable for the following components:   Glucose-Capillary 129 (*)    All other components within normal limits  PROTIME-INR  APTT  CBC  DIFFERENTIAL    EKG EKG Interpretation  Date/Time:  Saturday Jul 02 2020 10:42:02 EDT Ventricular Rate:  84 PR Interval:  142 QRS Duration: 80 QT Interval:  356 QTC Calculation: 420 R Axis:   45 Text Interpretation: Normal sinus rhythm Low voltage QRS Cannot rule out Anterior infarct , age undetermined Abnormal  ECG since last tracing no significant change Confirmed by Daleen Bo [phone]) on 07/02/2020 12:26:43 PM   Radiology CT HEAD WO CONTRAST  Result Date: 07/02/2020 CLINICAL DATA:  Dizziness since last night with  left facial numbness since this morning. EXAM: CT HEAD WITHOUT CONTRAST TECHNIQUE: Contiguous axial images were obtained from the base of the skull through the vertex without intravenous contrast. COMPARISON:  12/17/2017. FINDINGS: Brain: No evidence of acute infarction, hemorrhage, hydrocephalus, extra-axial collection or mass lesion/mass effect. Vascular: No hyperdense vessel or unexpected calcification. Skull: Normal. Negative for fracture or focal lesion. Sinuses/Orbits: Visualized globes and orbits are unremarkable. The visualized sinuses are clear. Other: None. IMPRESSION: Normal unenhanced CT scan of the brain. Electronically Signed   By: Lajean Manes M.D.   On: 07/02/2020 11:43   MR BRAIN WO CONTRAST  Result Date: 07/02/2020 CLINICAL DATA:  Neuro deficit, acute, stroke suspected. Acute onset of dizziness and lightheadedness at 11 p.m. last night. Left-sided facial numbness which has since resolved. EXAM: MRI HEAD WITHOUT CONTRAST TECHNIQUE: Multiplanar, multiecho pulse sequences of the brain and surrounding structures were obtained without intravenous contrast. COMPARISON:  CT head without contrast 07/02/2020. CTA head neck 12/17/2017. FINDINGS: Brain: No acute infarct, hemorrhage, or mass lesion is present. Two or three scattered subcortical T2 hyperintensities bilaterally are within normal limits for age. Basal ganglia are within normal limits. The ventricles are of normal size. No significant extraaxial fluid collection is present. The internal auditory canals are within normal limits. Vascular: Flow is present in the major intracranial arteries. Skull and upper cervical spine: The craniocervical junction is normal. Upper cervical spine is within normal limits. Marrow signal is  unremarkable. Sinuses/Orbits: The paranasal sinuses and mastoid air cells are clear. The globes and orbits are within normal limits. IMPRESSION: Normal MRI appearance of the brain for age. No acute or focal lesion to explain the patient's symptoms. Electronically Signed   By: San Morelle M.D.   On: 07/02/2020 14:39    Procedures Procedures   Medications Ordered in ED Medications  sodium chloride flush (NS) 0.9 % injection 3 mL (3 mLs Intravenous Given 07/02/20 1108)  LORazepam (ATIVAN) injection 2 mg (2 mg Intravenous Given 07/02/20 1342)    ED Course  I have reviewed the triage vital signs and the nursing notes.  Pertinent labs & imaging results that were available during my care of the patient were reviewed by me and considered in my medical decision making (see chart for details).  Clinical Course as of 07/02/20 1501  Sat Jul 02, 2020  1435 Case discussed with neurologist, following MRI imaging.  No indication for stroke.  Dr. Cheral Marker suggests that the patient likely has peripheral vertigo. [EW]    Clinical Course User Index [EW] Daleen Bo, MD   MDM Rules/Calculators/A&P                           Patient Vitals for the past 24 hrs:  BP Temp Pulse Resp SpO2 Height Weight  07/02/20 1430 126/79 -- 88 16 98 % -- --  07/02/20 1340 -- -- 72 -- 97 % -- --  07/02/20 1339 (!) 126/91 -- -- -- -- -- --  07/02/20 1315 124/80 -- 75 14 95 % -- --  07/02/20 1300 126/80 -- 77 12 96 % -- --  07/02/20 1253 -- -- -- -- -- 5\' 4"  (1.626 m) 70.3 kg  07/02/20 1242 (!) 158/84 -- 81 15 97 % -- --  07/02/20 1058 -- -- -- -- 99 % -- --  07/02/20 1030 (!) 164/82 97.9 F (36.6 C) 88 16 100 % -- --    2:55 PM Reevaluation with update and discussion. After initial  assessment and treatment, an updated evaluation reveals she is comfortable has no further complaints.  Findings discussed and questions answered. Daleen Bo   Medical Decision Making:  This patient is presenting for evaluation  of dizziness, which does require a range of treatment options, and is a complaint that involves a moderate risk of morbidity and mortality. The differential diagnoses include TIA, CVA, benign positional vertigo, nonspecific dizziness. I decided to review old records, and in summary Eh[place] female presenting for evaluation of dizziness which started last night around 9 PM and is still present.  I did not require additional historical information from Anyone.  Clinical Laboratory Tests Ordered, included CBC, Metabolic panel and PT/INR, APTT. Review indicates normal except mild elevation of glucose.  Radiologic Tests Ordered, included CT head, MRI brain.  I independently Visualized: Radiographic images, which show initial CT head, negative.  MRI no acute abnormalities.    Critical Interventions-clinical evaluation, laboratory testing, CT imaging, advanced imaging with MRI, discussion with neurology, observation reassess  After These Interventions, the Patient was reevaluated and was found stable for discharge to be treated for benign positional vertigo.  No CVA, TIA, metabolic disorder or hemodynamic disorder.  Recommend ENT follow-up for treatment with prescriptions for symptomatic control, meclizine and diazepam  CRITICAL CARE-no Performed by: Daleen Bo  Nursing Notes Reviewed/ Care Coordinated Applicable Imaging Reviewed Interpretation of Laboratory Data incorporated into ED treatment  The patient appears reasonably screened and/or stabilized for discharge and I doubt any other medical condition or other [facility] requiring further screening, evaluation, or treatment in the ED at this time prior to discharge.  Plan: Home Medications-continue usual; Home Treatments-rest, fluid; return here if the recommended treatment, does not improve the symptoms; Recommended follow up-ENT follow-up for further care and treatment     Final Clinical Impression(s) / ED Diagnoses Final diagnoses:  Benign  paroxysmal positional vertigo, unspecified laterality    Rx / DC Orders ED Discharge Orders         Ordered    diazepam (VALIUM) 5 MG tablet  Every 6 hours PRN        07/02/20 1459    meclizine (ANTIVERT) 25 MG tablet  3 times daily PRN        07/02/20 1459           Daleen Bo, MD 07/02/20 1501

## 2020-07-02 NOTE — ED Notes (Signed)
Pt back in room from MRI.

## 2020-07-02 NOTE — ED Provider Notes (Signed)
MSE was initiated and I personally evaluated the patient and placed orders (if any) at  10:53 AM on Jul 02, 2020.  The patient appears stable so that the remainder of the MSE may be completed by another provider.   Chief Complaint:  Dizziness, left-sided facial numbness.   HPI:   Patient states that around 11:00 last night she started feeling extremely dizzy, lightheaded sensation no vertigo.  States that it took her an hour to get from her downstairs to up to her room.  States that she went to bed without any neurodeficits besides the dizziness, woke up in the morning and continue to feel dizzy.  Again no vertigo like dizziness.  States that she then started feeling as if her left side of her face became numb.  Denies any weakness, vision changes, neglect, facial droop or speech deficits.  Left-sided numbness has mostly resolved.  Not currently dizzy.   ROS:  Left-sided numbness, dizziness.   Physical Exam:  Gen:                Awake, no distress  HEENT:          Atraumatic  Resp:              Normal effort  Cardiac:          Normal rate  Abd:                Nondistended, nontender  MSK:              Moves extremities without difficulty  Neuro:            Alert. Clear speech. No facial droop. CNIII-XII grossly intact.  No sensation changes from right to left side of face.  Bilateral upper and lower extremities' sensation grossly intact. 5/5 symmetric strength with grip strength and with plantar and dorsi flexion bilaterally.  Negative pronator drift.Gait is steady and intact.      Will not initiate code stroke at this time, patient is not Lucianne Lei positive.  However will obtain stroke work-up. Initiation of care has begun. The patient has been counseled on the process, plan, and necessity for staying for the completion/evaluation, and the remainder of the medical screening examination    Alfredia Client, PA-C 07/02/20 Sanders, [place], MD 07/03/20 [phone]

## 2020-07-02 NOTE — ED Notes (Signed)
Reviewed discharge instructions with patient. Follow-up care and medications reviewed. Patient  verbalized understanding. Patient A&Ox4, VSS, and ambulatory with steady gait upon discharge.  °

## 2020-07-02 NOTE — ED Notes (Signed)
Pt transported to MRI at this time 

## 2020-07-02 NOTE — ED Triage Notes (Signed)
C/o dizziness and stumbling last night that started at 11pm.  States it took her about an hour to go from couch to bed.  This morning she woke up with continued dizziness and stumbling and states it started to get a little better.  Reports L sided facial numbness since 9:45am.  No facial droop.  Speech clear.  Also reports BP elevated since last night.  No arm drift.  Reports L frozen shoulder since January.

## 2020-07-02 NOTE — Discharge Instructions (Addendum)
There are no signs of stroke, on the testing today.  We are treating you for BPV, with meclizine and diazepam.  You can use 1 or the other depending how serious his symptoms are.  The diazepam is stronger.  Do not drive when taking it.  Otherwise continue usual treatments.  Call the ENT doctor for follow-up care and treatment if needed

## 2020-09-28 ENCOUNTER — Other Ambulatory Visit (HOSPITAL_COMMUNITY)
Admission: RE | Admit: 2020-09-28 | Discharge: 2020-09-28 | Disposition: A | Discharge status: Home / Self Care | Payer: 59 | Source: Ambulatory Visit | Attending: Family Medicine | Admitting: Family Medicine

## 2020-09-28 DIAGNOSIS — Z01411 Encounter for gynecological examination (general) (routine) with abnormal findings: Secondary | ICD-10-CM | POA: Insufficient documentation

## 2020-09-29 LAB — CYTOLOGY - PAP
Comment: NEGATIVE
Diagnosis: NEGATIVE
High risk HPV: NEGATIVE

## 2020-10-17 ENCOUNTER — Ambulatory Visit (AMBULATORY_SURGERY_CENTER): Payer: 59 | Admitting: *Deleted

## 2020-10-17 ENCOUNTER — Other Ambulatory Visit: Payer: Self-pay

## 2020-10-17 VITALS — Ht 63.0 in | Wt 145.0 lb

## 2020-10-17 DIAGNOSIS — Z86010 Personal history of colonic polyps: Secondary | ICD-10-CM

## 2020-10-17 MED ORDER — SUTAB 1479-225-188 MG PO TABS: 1.0000 | ORAL_TABLET | Freq: Once | ORAL | 0 refills | Status: AC

## 2020-10-17 NOTE — Progress Notes (Signed)
Conducted virtual pre visit over telephone. Instructions sent through both MyChart and email [email or web address]. SuTab coupon mailed to patient.  No egg or soy allergy known to patient  No issues with past sedation with any surgeries or procedures Patient denies ever being told they had issues or difficulty with intubation  No FH of Malignant Hyperthermia No diet pills per patient No home 02 use per patient  No blood thinners per patient   Pt denies issues with constipation   No A fib or A flutter  EMMI video to pt or via MyChart  COVID 19 guidelines implemented in [place] today with Pt and RN   Code to Pharmacy and  NO PA's for preps discussed with pt In PV today  Discussed with pt there will be an out-of-pocket cost for prep and that varies from $0 to 70 +  dollars   Due to the COVID-19 pandemic we are asking patients to follow certain guidelines.  Pt aware of COVID protocols and [facility] guidelines

## 2020-11-07 ENCOUNTER — Encounter: Payer: Self-pay | Admitting: Gastroenterology

## 2020-11-16 ENCOUNTER — Ambulatory Visit (AMBULATORY_SURGERY_CENTER): Payer: 59 | Admitting: Gastroenterology

## 2020-11-16 ENCOUNTER — Other Ambulatory Visit: Payer: Self-pay

## 2020-11-16 ENCOUNTER — Encounter: Payer: Self-pay | Admitting: Gastroenterology

## 2020-11-16 VITALS — BP 93/49 | HR 54 | Temp 98.0°F | Resp 10 | Ht 63.0 in | Wt 145.0 lb

## 2020-11-16 DIAGNOSIS — K635 Polyp of colon: Secondary | ICD-10-CM | POA: Diagnosis not present

## 2020-11-16 DIAGNOSIS — Z86010 Personal history of colonic polyps: Secondary | ICD-10-CM

## 2020-11-16 DIAGNOSIS — D120 Benign neoplasm of cecum: Secondary | ICD-10-CM

## 2020-11-16 MED ORDER — SODIUM CHLORIDE 0.9 % IV SOLN: 500.0000 mL | Freq: Once | INTRAVENOUS | Status: DC

## 2020-11-16 NOTE — Progress Notes (Signed)
[facility] History and Physical   Primary Care Physician:  Maurice Small, MD   Reason for Procedure:  History of adenomatous colon polyps  Plan:    Surveillance colonoscopy with possible interventions as needed     HPI: Nicole Cantu is a very pleasant 65 y.o. female here for colonoscopy. Denies any nausea, vomiting, abdominal pain, melena or bright red blood per rectum  The risks and benefits as well as alternatives of endoscopic procedure(s) have been discussed and reviewed. All questions answered. The patient agrees to proceed.    Past Medical History:  Diagnosis Date   Anxiety    Arrhythmia    Holter 01/26/2008-HR [phone] rare PVC .pac No adverse arrhythmia   Arthritis    Back pain    Benign intracranial hypertension    Dr Jannifer Franklin    Chest pain    NUC stress - no ischemia   Colon polyps    Fatigue    Hyperlipidemia    Hypertension    Pulsatile tinnitus of both ears     Past Surgical History:  Procedure Laterality Date   COLONOSCOPY     WISDOM TOOTH EXTRACTION     about 15 years ago    Prior to Admission medications   Medication Sig Start Date End Date Taking? Authorizing Provider  ALPRAZolam (XANAX) 0.25 MG tablet Take 0.25 mg by mouth 3 (three) times daily as needed for anxiety.   Yes [provider]  lisinopril-hydrochlorothiazide (PRINZIDE,ZESTORETIC) 10-12.5 MG per tablet Take 1 tablet by mouth daily.   Yes [provider]  simvastatin (ZOCOR) 40 MG tablet Take 40 mg by mouth daily.   Yes [provider]  Turmeric (QC TUMERIC COMPLEX PO) Take by mouth.   Yes [provider]  benzonatate (TESSALON) 200 MG capsule  07/05/17   [provider]  diazepam (VALIUM) 5 MG tablet Take 1 tablet (5 mg total) by mouth every 6 (six) hours as needed (Vertigo). Patient not taking: No sig reported 07/02/20   Daleen Bo, MD  meclizine (ANTIVERT) 25 MG tablet Take 1 tablet (25 mg total) by mouth 3 (three) times  daily as needed for dizziness. Patient not taking: No sig reported 07/02/20   Daleen Bo, MD  zolpidem (AMBIEN) 5 MG tablet Take 5 mg by mouth at bedtime as needed for sleep.    [provider]    Current Outpatient Medications  Medication Sig Dispense Refill   ALPRAZolam (XANAX) 0.25 MG tablet Take 0.25 mg by mouth 3 (three) times daily as needed for anxiety.     lisinopril-hydrochlorothiazide (PRINZIDE,ZESTORETIC) 10-12.5 MG per tablet Take 1 tablet by mouth daily.     simvastatin (ZOCOR) 40 MG tablet Take 40 mg by mouth daily.     Turmeric (QC TUMERIC COMPLEX PO) Take by mouth.     benzonatate (TESSALON) 200 MG capsule  (Patient not taking: No sig reported)     diazepam (VALIUM) 5 MG tablet Take 1 tablet (5 mg total) by mouth every 6 (six) hours as needed (Vertigo). (Patient not taking: No sig reported) 30 tablet 0   meclizine (ANTIVERT) 25 MG tablet Take 1 tablet (25 mg total) by mouth 3 (three) times daily as needed for dizziness. (Patient not taking: No sig reported) 30 tablet 0   zolpidem (AMBIEN) 5 MG tablet Take 5 mg by mouth at bedtime as needed for sleep.     Current Facility-Administered Medications  Medication Dose Route Frequency Provider Last Rate Last Admin   0.9 %  sodium  chloride infusion  500 mL Intravenous Once Raevin Wierenga V, MD       0.9 %  sodium chloride infusion  500 mL Intravenous Once Mauri Pole, MD        Allergies as of 11/16/2020   (No Known Allergies)    Family History  Problem Relation Age of Onset   Breast cancer Mother    Colon cancer Neg Hx    Esophageal cancer Neg Hx    Liver cancer Neg Hx    Pancreatic cancer Neg Hx    Rectal cancer Neg Hx    Stomach cancer Neg Hx     Social History   Socioeconomic History   Marital status: Married    Spouse name: Not on file   Number of children: Not on file   Years of education: Not on file   Highest education level: Not on file  Occupational History   Not on file  Tobacco  Use   Smoking status: Never   Smokeless tobacco: Never  Substance and Sexual Activity   Alcohol use: Yes    Alcohol/week: 1.0 standard drink    Types: 1 Cans of beer per week    Comment: Socially   Drug use: Never   Sexual activity: Not on file  Other Topics Concern   Not on file  Social History Narrative   Tobacco use cigarettes: Never smoked   Tobacco history last updated 06/01/2013   No smoking   No Alcohol    Caffeine yes,soda occasionally   No Recreational drug use   Diet yes, vegetarian   Occupation retired    Marital Status married   Children : none   Social Determinants of Radio broadcast assistant Strain: Not on file  Food Insecurity: Not on file  Transportation Needs: Not on file  Physical Activity: Not on file  Stress: Not on file  Social Connections: Not on file  Intimate Partner Violence: Not on file    Review of Systems:  All other review of systems negative except as mentioned in the HPI.  Physical Exam: Vital signs in last 24 hours: BP 114/75   Pulse 66   Temp 98 F (36.7 C) (Temporal)   Ht 5\' 3"  (1.6 m)   Wt 145 lb (65.8 kg)   SpO2 99%   BMI 25.69 kg/m     General:   Alert, NAD Lungs:  Clear .   Heart:  Regular rate and rhythm Abdomen:  Soft, nontender and nondistended. Neuro/Psych:  Alert and cooperative. Normal mood and affect. A and O x 3  Reviewed labs, radiology imaging, old records and pertinent past GI work up  Patient is appropriate for planned procedure(s) and anesthesia in an ambulatory setting   K. Denzil Magnuson , MD [phone]

## 2020-11-16 NOTE — Op Note (Signed)
[facility] Patient Name: Nicole Cantu Procedure Date: 11/16/2020 2:25 PM MRN: [id number] Endoscopist: Mauri Pole , MD Age: 65 Referring MD:  Date of Birth: 1956/01/07 Gender: Female Account #: [id number] Procedure:                Colonoscopy Indications:              High risk colon cancer surveillance: Personal                            history of colonic polyps, High risk colon cancer                            surveillance: Personal history of multiple (3 or                            more) adenomas Medicines:                Monitored Anesthesia Care Procedure:                Pre-Anesthesia Assessment:                           - Prior to the procedure, a History and Physical                            was performed, and patient medications and                            allergies were reviewed. The patient's tolerance of                            previous anesthesia was also reviewed. The risks                            and benefits of the procedure and the sedation                            options and risks were discussed with the patient.                            All questions were answered, and informed consent                            was obtained. Prior Anticoagulants: The patient has                            taken no previous anticoagulant or antiplatelet                            agents. ASA Grade Assessment: II - A patient with                            mild systemic disease. After reviewing the risks  and benefits, the patient was deemed in                            satisfactory condition to undergo the procedure.                           After obtaining informed consent, the colonoscope                            was passed under direct vision. Throughout the                            procedure, the patient's blood pressure, pulse, and                            oxygen saturations were monitored continuously.  The                            PCF-HQ190L Colonoscope was introduced through the                            anus and advanced to the the cecum, identified by                            appendiceal orifice and ileocecal valve. The                            colonoscopy was performed without difficulty. The                            patient tolerated the procedure well. The quality                            of the bowel preparation was excellent. The                            ileocecal valve, appendiceal orifice, and rectum                            were photographed. Scope In: 2:44:15 PM Scope Out: 3:00:07 PM Scope Withdrawal Time: 0 hours 10 minutes 16 seconds  Total Procedure Duration: 0 hours 15 minutes 52 seconds  Findings:                 The perianal and digital rectal examinations were                            normal.                           A less than 1 mm polyp was found in the cecum. The                            polyp was sessile. The polyp was removed with a  cold biopsy forceps. Resection and retrieval were                            complete.                           Two sessile polyps were found in the sigmoid colon                            and transverse colon. The polyps were 4 to 6 mm in                            size. These polyps were removed with a cold snare.                            Resection and retrieval were complete.                           Non-bleeding external and internal hemorrhoids were                            found during retroflexion. The hemorrhoids were                            large.                           The exam was otherwise without abnormality. Complications:            No immediate complications. Estimated Blood Loss:     Estimated blood loss was minimal. Impression:               - One less than 1 mm polyp in the cecum, removed                            with a cold biopsy forceps. Resected  and retrieved.                           - Two 4 to 6 mm polyps in the sigmoid colon and in                            the transverse colon, removed with a cold snare.                            Resected and retrieved.                           - Non-bleeding external and internal hemorrhoids.                           - The examination was otherwise normal. Recommendation:           - Patient has a contact number available for  emergencies. The signs and symptoms of potential                            delayed complications were discussed with the                            patient. Return to normal activities tomorrow.                            Written discharge instructions were provided to the                            patient.                           - Resume previous diet.                           - Continue present medications.                           - Await pathology results.                           - Repeat colonoscopy in 3 - 5 years for                            surveillance based on pathology results. Mauri Pole, MD 11/16/2020 3:09:41 PM This report has been signed electronically.

## 2020-11-16 NOTE — Progress Notes (Signed)
VS AR

## 2020-11-16 NOTE — Progress Notes (Signed)
Called to room to assist during endoscopic procedure.  Patient ID and intended procedure confirmed with present staff. Received instructions for my participation in the procedure from the performing physician.  

## 2020-11-16 NOTE — Progress Notes (Signed)
To pacu, VSS. Report to [email or web address] 

## 2020-11-16 NOTE — Patient Instructions (Signed)
Resume previous diet and continue present medications. Awaiting pathology results. Repeat colonoscopy in 3-5 years for surveillance based on pathology results.  YOU HAD AN ENDOSCOPIC PROCEDURE TODAY AT [facility]:   Refer to the procedure report that was given to you for any specific questions about what was found during the examination.  If the procedure report does not answer your questions, please call your gastroenterologist to clarify.  If you requested that your care partner not be given the details of your procedure findings, then the procedure report has been included in a sealed envelope for you to review at your convenience later.  YOU SHOULD EXPECT: Some feelings of bloating in the abdomen. Passage of more gas than usual.  Walking can help get rid of the air that was put into your GI tract during the procedure and reduce the bloating. If you had a lower endoscopy (such as a colonoscopy or flexible sigmoidoscopy) you may notice spotting of blood in your stool or on the toilet paper. If you underwent a bowel prep for your procedure, you may not have a normal bowel movement for a few days.  Please Note:  You might notice some irritation and congestion in your nose or some drainage.  This is from the oxygen used during your procedure.  There is no need for concern and it should clear up in a day or so.  SYMPTOMS TO REPORT IMMEDIATELY:  Following lower endoscopy (colonoscopy or flexible sigmoidoscopy):  Excessive amounts of blood in the stool  Significant tenderness or worsening of abdominal pains  Swelling of the abdomen that is new, acute  Fever of 100F or higher   For urgent or emergent issues, a gastroenterologist can be reached at any hour by calling [phone]. Do not use MyChart messaging for urgent concerns.    DIET:  We do recommend a small meal at first, but then you may proceed to your regular diet.  Drink plenty of fluids but you should avoid alcoholic  beverages for 24 hours.  ACTIVITY:  You should plan to take it easy for the rest of today and you should NOT DRIVE or use heavy machinery until tomorrow (because of the sedation medicines used during the test).    FOLLOW UP: Our staff will call the number listed on your records 48-72 hours following your procedure to check on you and address any questions or concerns that you may have regarding the information given to you following your procedure. If we do not reach you, we will leave a message.  We will attempt to reach you two times.  During this call, we will ask if you have developed any symptoms of COVID 19. If you develop any symptoms (ie: fever, flu-like symptoms, shortness of breath, cough etc.) before then, please call [phone].  If you test positive for Covid 19 in the 2 weeks post procedure, please call and report this information to [place].    If any biopsies were taken you will be contacted by phone or by letter within the next 1-3 weeks.  Please call us at [phone] if you have not heard about the biopsies in 3 weeks.    SIGNATURES/CONFIDENTIALITY: You and/or your care partner have signed paperwork which will be entered into your electronic medical record.  These signatures attest to the fact that that the information above on your After Visit Summary has been reviewed and is understood.  Full responsibility of the confidentiality of this discharge information lies with you  and/or your care-partner.

## 2020-11-18 ENCOUNTER — Telehealth: Payer: Self-pay

## 2020-11-18 NOTE — Telephone Encounter (Signed)
Left message on answering machine. 

## 2020-11-29 ENCOUNTER — Encounter: Payer: Self-pay | Admitting: Gastroenterology

## 2020-12-28 ENCOUNTER — Other Ambulatory Visit: Payer: Self-pay

## 2020-12-28 ENCOUNTER — Ambulatory Visit: Payer: 59 | Admitting: Obstetrics and Gynecology

## 2020-12-28 ENCOUNTER — Encounter: Payer: Self-pay | Admitting: Obstetrics and Gynecology

## 2020-12-28 VITALS — BP 116/80 | HR 93 | Ht 63.5 in | Wt 141.0 lb

## 2020-12-28 DIAGNOSIS — N393 Stress incontinence (female) (male): Secondary | ICD-10-CM | POA: Diagnosis not present

## 2020-12-28 DIAGNOSIS — N3000 Acute cystitis without hematuria: Secondary | ICD-10-CM

## 2020-12-28 DIAGNOSIS — N812 Incomplete uterovaginal prolapse: Secondary | ICD-10-CM | POA: Diagnosis not present

## 2020-12-28 DIAGNOSIS — R350 Frequency of micturition: Secondary | ICD-10-CM | POA: Diagnosis not present

## 2020-12-28 DIAGNOSIS — N8110 Cystocele, unspecified: Secondary | ICD-10-CM | POA: Diagnosis not present

## 2020-12-28 LAB — POCT URINALYSIS DIPSTICK
Appearance: NORMAL
Bilirubin, UA: NEGATIVE
Blood, UA: NEGATIVE
Glucose, UA: NEGATIVE
Ketones, UA: NEGATIVE
Leukocytes, UA: NEGATIVE
Nitrite, UA: NEGATIVE
Protein, UA: NEGATIVE
Spec Grav, UA: 1.015 (ref 1.010–1.025)
Urobilinogen, UA: 0.2 E.U./dL
pH, UA: 7 (ref 5.0–8.0)

## 2020-12-28 NOTE — Progress Notes (Signed)
[facility] New Patient Evaluation and Consultation  Referring Provider: Maurice Small, MD PCP: Glenis Smoker, MD Date of Service: 12/28/2020  SUBJECTIVE Chief Complaint: New Patient (Initial Visit) - prolapse  History of Present Illness: Nicole Cantu is a 65 y.o. White or Caucasian female presenting for evaluation of prolapse.    Urinary Symptoms: Leaks urine with cough/ sneeze, lifting, and going from sitting to standing Leaks 0-1 time(s) per day.  Pad use: none She is bothered by her UI symptoms.  Day time voids 5-6.  Nocturia: 1 times per night to void. Voiding dysfunction: she empties her bladder well.  does not use a catheter to empty bladder.  When urinating, she feels she has no difficulties   UTIs:  0  UTI's in the last year.   Denies history of blood in urine and kidney or bladder stones  Pelvic Organ Prolapse Symptoms:                  She Admits to a feeling of a bulge the vaginal area. It has been present for a few years. Has been worsening with activity outside.  This bulge is sometimes bothersome.  Bowel Symptom: Bowel movements: 1 time(s) per day Stool consistency: soft  Straining: no.  Splinting: no.  Incomplete evacuation: no.  She Denies accidental bowel leakage / fecal incontinence Bowel regimen: none  Sexual Function Sexually active: no.   Pelvic Pain Denies regular pelvic pain. Sometimes has pain in the center.    Past Medical History:  Past Medical History:  Diagnosis Date   Anxiety    Arrhythmia    Holter 01/26/2008-HR [id number] rare PVC .pac No adverse arrhythmia   Arthritis    Back pain    Benign intracranial hypertension    Dr Jannifer Franklin    Chest pain    NUC stress - no ischemia   Colon polyps    Fatigue    Hyperlipidemia    Hypertension    Pulsatile tinnitus of both ears      Past Surgical History:   Past Surgical History:  Procedure Laterality Date   COLONOSCOPY     WISDOM TOOTH EXTRACTION      about 15 years ago     Past OB/GYN History:  OB History  Gravida Para Term Preterm AB Living  0 0 0 0 0 0  SAB IAB Ectopic Multiple Live Births  0 0 0 0 0    Menopausal: Yes, Denies vaginal bleeding since menopause Last pap smear was 09/2020- neg.    Medications: She has a current medication list which includes the following prescription(s): alprazolam, lisinopril-hydrochlorothiazide, simvastatin, turmeric, and zolpidem.   Allergies: Patient has No Known Allergies.   Social History:  Social History   Tobacco Use   Smoking status: Never   Smokeless tobacco: Never  Vaping Use   Vaping Use: Never used  Substance Use Topics   Alcohol use: Yes    Alcohol/week: 1.0 standard drink    Types: 1 Cans of beer per week    Comment: Socially   Drug use: Never    Relationship status: married She lives with spouse.   She is not employed. Regular exercise: Yes:   History of abuse: No  Family History:   Family History  Problem Relation Age of Onset   Breast cancer Mother    Colon cancer Neg Hx    Esophageal cancer Neg Hx    Liver cancer Neg Hx    Pancreatic cancer Neg Hx  Rectal cancer Neg Hx    Stomach cancer Neg Hx      Review of Systems: Review of Systems  Constitutional:  Negative for fever, malaise/fatigue and weight loss.  Respiratory:  Negative for cough, shortness of breath and wheezing.   Cardiovascular:  Negative for chest pain, palpitations and leg swelling.  Gastrointestinal:  Negative for abdominal pain and blood in stool.  Genitourinary:  Negative for dysuria.  Musculoskeletal:  Negative for myalgias.  Skin:  Negative for rash.  Neurological:  Negative for dizziness and headaches.  Endo/Heme/Allergies:  Does not bruise/bleed easily.  Psychiatric/Behavioral:  Negative for depression. The patient is not nervous/anxious.     OBJECTIVE Physical Exam: Vitals:   12/28/20 1330  BP: 116/80  Pulse: 93  Weight: 141 lb (64 kg)  Height: 5' 3.5" (1.613 m)     Physical Exam Constitutional:      General: She is not in acute distress. Pulmonary:     Effort: Pulmonary effort is normal.  Abdominal:     General: There is no distension.     Palpations: Abdomen is soft.     Tenderness: There is no abdominal tenderness. There is no rebound.  Musculoskeletal:        General: No swelling. Normal range of motion.  Skin:    General: Skin is warm and dry.     Findings: No rash.  Neurological:     Mental Status: She is alert and oriented to person, place, and time.  Psychiatric:        Mood and Affect: Mood normal.        Behavior: Behavior normal.     GU / Detailed Urogynecologic Evaluation:  Pelvic Exam: Normal external female genitalia; Bartholin's and Skene's glands normal in appearance; urethral meatus normal in appearance, no urethral masses or discharge.   CST: negative  Speculum exam reveals normal vaginal mucosa with atrophy. Cervix normal appearance. Uterus normal single, nontender. Adnexa no mass, fullness, tenderness.     Pelvic floor strength II/V  Pelvic floor musculature: Right levator non-tender, Right obturator non-tender, Left levator non-tender, Left obturator non-tender  POP-Q:   POP-Q  1                                            Aa   1                                           Ba  1.5                                              C   4                                            Gh  5                                            Pb  6  tvl   -2                                            Ap  -2                                            Bp  -4.5                                              D     Rectal Exam:  Normal external rectum  Post-Void Residual (PVR) by Bladder Scan: In order to evaluate bladder emptying, we discussed obtaining a postvoid residual and she agreed to this procedure.  Procedure: The ultrasound unit was placed on the patient's abdomen  in the suprapubic region after the patient had voided. A PVR of 10 ml was obtained by bladder scan.  Laboratory Results: POC urine: neg   ASSESSMENT AND PLAN [name] is a 65 y.o. with:  1. Uterovaginal prolapse, incomplete   2. Prolapse of anterior vaginal wall   3. Urinary frequency   4. SUI (stress urinary incontinence, female)    Stage II anterior, Stage I posterior, Stage III apical prolapse - For treatment of pelvic organ prolapse, we discussed options for management including expectant management, conservative management, and surgical management, such as Kegels, a pessary, pelvic floor physical therapy, and specific surgical procedures. - She would like to try a pessary, will return for a fitting.   2. SUI - rare leakage, can try pessary  Return for pessary fitting  Jaquita Folds, MD  Time spent: I spent 45 minutes dedicated to the care of this patient on the date of this encounter to include pre-visit review of records, face-to-face time with the patient discussing and post visit documentation.

## 2020-12-31 LAB — URINE CULTURE

## 2021-01-02 ENCOUNTER — Encounter: Payer: Self-pay | Admitting: Obstetrics and Gynecology

## 2021-01-02 MED ORDER — SULFAMETHOXAZOLE-TRIMETHOPRIM 800-160 MG PO TABS: 1.0000 | ORAL_TABLET | Freq: Two times a day (BID) | ORAL | 0 refills | Status: AC

## 2021-01-02 NOTE — Addendum Note (Signed)
Addended by: Jaquita Folds on: 01/02/2021 07:19 AM   Modules accepted: Orders

## 2021-01-18 ENCOUNTER — Ambulatory Visit (INDEPENDENT_AMBULATORY_CARE_PROVIDER_SITE_OTHER): Payer: 59 | Admitting: Obstetrics and Gynecology

## 2021-01-18 ENCOUNTER — Other Ambulatory Visit: Payer: Self-pay

## 2021-01-18 ENCOUNTER — Encounter: Payer: Self-pay | Admitting: Obstetrics and Gynecology

## 2021-01-18 VITALS — BP 148/89 | HR 96 | Ht 62.0 in | Wt 140.0 lb

## 2021-01-18 DIAGNOSIS — N8110 Cystocele, unspecified: Secondary | ICD-10-CM | POA: Diagnosis not present

## 2021-01-18 DIAGNOSIS — N812 Incomplete uterovaginal prolapse: Secondary | ICD-10-CM | POA: Diagnosis not present

## 2021-01-18 DIAGNOSIS — N393 Stress incontinence (female) (male): Secondary | ICD-10-CM

## 2021-01-18 NOTE — Progress Notes (Signed)
[facility]   Subjective:     Chief Complaint: Pessary Fitting  History of Present Illness: Nicole Cantu is a 65 y.o. female with stage II pelvic organ prolapse and stress incontinence who presents today for a pessary fitting.    Past Medical History: Patient  has a past medical history of Anxiety, Arrhythmia, Arthritis, Back pain, Benign intracranial hypertension, Chest pain, Colon polyps, Fatigue, Hyperlipidemia, Hypertension, and Pulsatile tinnitus of both ears.   Past Surgical History: She  has a past surgical history that includes Colonoscopy and Wisdom tooth extraction.   Medications: She has a current medication list which includes the following prescription(s): alprazolam, lisinopril-hydrochlorothiazide, simvastatin, turmeric, and zolpidem.   Allergies: Patient has No Known Allergies.   Social History: Patient  reports that she has never smoked. She has never used smokeless tobacco. She reports current alcohol use of about 1.0 standard drink per week. She reports that she does not use drugs.      Objective:    BP (!) 148/89   Pulse 96   Ht 5\' 2"  (1.575 m)   Wt 140 lb (63.5 kg)   BMI 25.61 kg/m  Gen: No apparent distress, A&O x 3. Pelvic Exam: Normal external female genitalia; Bartholin's and Skene's glands normal in appearance; urethral meatus normal in appearance, no urethral masses or discharge.   A size #2 incontinence ring with support pessary was fitted. It was comfortable, stayed in place with bending and valsalva and was an appropriate size on examination, with one finger fitting between the pessary and the vaginal walls. We tied a string to it and the patient demonstrated proper removal and replacement.   POP-Q (12/28/20):    POP-Q   1                                            Aa   1                                           Ba   1.5                                              C    4                                            Gh    5                                            Pb   6                                            tvl    -2  Ap   -2                                            Bp   -4.5                                              D          Assessment/Plan:    Assessment: [name] is a 65 y.o. with stage II pelvic organ prolapse and stress incontinence who presents for a pessary fitting.  Plan: She was fitted with a #2 incontinence ring with support pessary.  She will remove at least weekly and keep out overnight. Advised to wash with soap and water.  .   Follow-up in 2-3 weeks for a pessary check or sooner as needed.  All questions were answered.    Jaquita Folds, MD

## 2021-01-28 ENCOUNTER — Encounter: Payer: Self-pay | Admitting: Obstetrics and Gynecology

## 2021-01-30 NOTE — Progress Notes (Signed)
[facility]   Subjective:     Chief Complaint: No chief complaint on file.  History of Present Illness: Nicole Cantu is a 65 y.o. female with stage II pelvic organ prolapse and stress incontinence who presents for a pessary check. She is using a size #2 incontinence ring with support pessary. She has noticed more urgency/ spasm and incontinence with the pessary in place. Symptoms went away when she removed the pessary. But feels better the pessary in.   She is not sure if she is getting the knob in the correct place. Tried to move it and scratched herself and was bleeding.   Past Medical History: Patient  has a past medical history of Anxiety, Arrhythmia, Arthritis, Back pain, Benign intracranial hypertension, Chest pain, Colon polyps, Fatigue, Hyperlipidemia, Hypertension, and Pulsatile tinnitus of both ears.   Past Surgical History: She  has a past surgical history that includes Colonoscopy and Wisdom tooth extraction.   Medications: She has a current medication list which includes the following prescription(s): alprazolam, lisinopril-hydrochlorothiazide, simvastatin, turmeric, and zolpidem.   Allergies: Patient has No Known Allergies.   Social History: Patient  reports that she has never smoked. She has never used smokeless tobacco. She reports current alcohol use of about 1.0 standard drink per week. She reports that she does not use drugs.      Objective:    Physical Exam: BP (!) 145/92   Pulse (!) 109  Gen: No apparent distress, A&O x 3. Detailed Urogynecologic Evaluation:  Pelvic Exam: Normal external female genitalia; Bartholin's and Skene's glands normal in appearance; urethral meatus normal in appearance, no urethral masses or discharge. A #2 ring with support pessary was placed (Lot #[id number], Exp 09/06/25). It was comfortable to the patient and fit well.   POP-Q (12/28/20):    POP-Q   1                                            Aa   1                                            Ba   1.5                                              C    4                                            Gh   5                                            Pb   6                                            tvl    -2  Ap   -2                                            Bp   -4.5                                              D         Assessment/Plan:    Assessment: Nicole Cantu is a 65 y.o. with stage II pelvic organ prolapse and stress incontinence here for a pessary check. Now with urgency symptoms with pessary in place.   Plan: - We discussed that sometimes leakage symptoms can change when the bladder is back in place. But since she is leaking more now, will try pessary without a knob. Chose the same size without knob- #2 ring with support.  - She will remove pessary weekly and wash with soap and water. Will also keep other pessary and try that again if desired.   All questions were answered.   Time Spent: Time spent: I spent 20 minutes dedicated to the care of this patient on the date of this encounter to include pre-visit review of records, face-to-face time with the patient and post visit documentation.

## 2021-01-31 ENCOUNTER — Encounter: Payer: Self-pay | Admitting: Obstetrics and Gynecology

## 2021-01-31 ENCOUNTER — Other Ambulatory Visit: Payer: Self-pay

## 2021-01-31 ENCOUNTER — Ambulatory Visit (INDEPENDENT_AMBULATORY_CARE_PROVIDER_SITE_OTHER): Payer: 59 | Admitting: Obstetrics and Gynecology

## 2021-01-31 VITALS — BP 145/92 | HR 109

## 2021-01-31 DIAGNOSIS — N8110 Cystocele, unspecified: Secondary | ICD-10-CM

## 2021-01-31 DIAGNOSIS — N812 Incomplete uterovaginal prolapse: Secondary | ICD-10-CM | POA: Diagnosis not present

## 2021-01-31 NOTE — Patient Instructions (Signed)
You were fitted with a #2 ring with support pessary. Try this instead of the pessary with the knob to see if your symptoms improve.  Clean with liquid soap and water in between uses and leave out over night on the night that you remove it. Call for any problems.

## 2021-02-01 ENCOUNTER — Encounter: Payer: Self-pay | Admitting: Obstetrics and Gynecology

## 2021-02-01 DIAGNOSIS — N952 Postmenopausal atrophic vaginitis: Secondary | ICD-10-CM

## 2021-02-02 MED ORDER — ESTRADIOL 0.1 MG/GM VA CREA: 0.5000 g | TOPICAL_CREAM | VAGINAL | 11 refills | Status: AC

## 2021-02-09 ENCOUNTER — Ambulatory Visit: Payer: 59 | Admitting: Obstetrics and Gynecology

## 2021-02-15 ENCOUNTER — Ambulatory Visit (INDEPENDENT_AMBULATORY_CARE_PROVIDER_SITE_OTHER): Payer: 59 | Admitting: Obstetrics and Gynecology

## 2021-02-15 ENCOUNTER — Encounter: Payer: Self-pay | Admitting: Obstetrics and Gynecology

## 2021-02-15 ENCOUNTER — Other Ambulatory Visit: Payer: Self-pay

## 2021-02-15 VITALS — BP 150/91 | HR 90

## 2021-02-15 DIAGNOSIS — N8110 Cystocele, unspecified: Secondary | ICD-10-CM

## 2021-02-15 NOTE — Progress Notes (Signed)
[facility]   Subjective:     Chief Complaint:  Chief Complaint  Patient presents with   Pessary Check   History of Present Illness: Nicole Cantu is a 65 y.o. female with stage II pelvic organ prolapse and stress incontinence who presents for a pessary check. She is using a size #2 ring with support pessary. The pessary fell out once with a BM but then since replacing has stayed in without any problems. She is using vaginal estrogen. She denies vaginal bleeding.  Past Medical History: Patient  has a past medical history of Anxiety, Arrhythmia, Arthritis, Back pain, Benign intracranial hypertension, Chest pain, Colon polyps, Fatigue, Hyperlipidemia, Hypertension, and Pulsatile tinnitus of both ears.   Past Surgical History: She  has a past surgical history that includes Colonoscopy and Wisdom tooth extraction.   Medications: She has a current medication list which includes the following prescription(s): alprazolam, estradiol, lisinopril-hydrochlorothiazide, simvastatin, turmeric, and zolpidem.   Allergies: Patient has No Known Allergies.   Social History: Patient  reports that she has never smoked. She has never used smokeless tobacco. She reports current alcohol use of about 1.0 standard drink per week. She reports that she does not use drugs.      Objective:    Physical Exam: BP (!) 150/91    Pulse 90  Gen: No apparent distress, A&O x 3. Detailed Urogynecologic Evaluation:  Pelvic Exam: Normal external female genitalia; Bartholin's and Skene's glands normal in appearance; urethral meatus normal in appearance, no urethral masses or discharge. The pessary was noted to be in place. It was removed and cleaned. Speculum exam revealed no lesions in the vagina. The pessary was replaced. It was comfortable to the patient and fit well.  POP-Q (12/28/20):    POP-Q   1                                            Aa   1                                           Ba   1.5                                               C    4                                            Gh   5                                            Pb   6                                            tvl    -2  Ap   -2                                            Bp   -4.5                                              D        Assessment/Plan:    Assessment: [name] is a 66 y.o. with stage II pelvic organ prolapse here for a pessary check. She is doing well.  Plan: - remove pessary at least weekly.  - continue vaginal estrogen twice a week  Return 6 months for pessary check or sooner if needed  Jaquita Folds, MD    Time Spent: Time spent: I spent 20 minutes dedicated to the care of this patient on the date of this encounter to include pre-visit review of records, face-to-face time with the patient and post visit documentation.

## 2021-03-12 ENCOUNTER — Encounter: Payer: Self-pay | Admitting: Obstetrics and Gynecology

## 2021-03-13 NOTE — Progress Notes (Signed)
[facility]   Subjective:     Chief Complaint:  No chief complaint on file.  History of Present Illness: Nicole Cantu is a 66 y.o. female with stage II pelvic organ prolapse and stress incontinence who presents for a pessary check. She is using a size #2 ring with support pessary.  She has been noticing a bulge even with the pessary in place. It has been causing some discomfort on and off. She is not sure if she is placing it correctly. She has been using the vaginal estrogen cream twice a week.   She occasionally also has a small dribble of urine. She is interested in pelvic floor PT. Currently doing PT for her back (which will be ongoing).   Past Medical History: Patient  has a past medical history of Anxiety, Arrhythmia, Arthritis, Back pain, Benign intracranial hypertension, Chest pain, Colon polyps, Fatigue, Hyperlipidemia, Hypertension, and Pulsatile tinnitus of both ears.   Past Surgical History: She  has a past surgical history that includes Colonoscopy and Wisdom tooth extraction.   Medications: She has a current medication list which includes the following prescription(s): alprazolam, estradiol, lisinopril-hydrochlorothiazide, simvastatin, turmeric, and zolpidem.   Allergies: Patient has No Known Allergies.   Social History: Patient  reports that she has never smoked. She has never used smokeless tobacco. She reports current alcohol use of about 1.0 standard drink per week. She reports that she does not use drugs.      Objective:    Physical Exam: BP 128/78    Pulse (!) 108  Gen: No apparent distress, A&O x 3. Detailed Urogynecologic Evaluation:  Pelvic Exam: Normal external female genitalia; Bartholin's and Skene's glands normal in appearance; urethral meatus normal in appearance, no urethral masses or discharge. The pessary was noted to be in place. It was removed and cleaned. Speculum exam revealed no lesions in the vagina.   A #3 shaatz pessary was  placed. This fit well but there was still some extra space present. A #4 shaatz pessary was placed. It was comfortable, fit well, and stayed in placed with strong alsalva and bending. The patient demonstrated proper placement and removal with a string tied to the pessary. Lot # [id number]    POP-Q (12/28/20):    POP-Q   1                                            Aa   1                                           Ba   1.5                                              C    4                                            Gh   5  Pb   6                                            tvl    -2                                            Ap   -2                                            Bp   -4.5                                              D        Assessment/Plan:    Assessment: Nicole Cantu is a 66 y.o. with stage II pelvic organ prolapse here for a pessary check. Fitted with new pessary today  Plan: - Fitted with a #4 shaatz pessary. Remove pessary at least weekly and clean with soap and water. - Continue vaginal estrogen twice a week - referral placed for pelvic PT for her stress incontinence.   Return 2-3 weeks for pessary check or sooner if needed  Jaquita Folds, MD    Time Spent: Time spent: I spent 20 minutes dedicated to the care of this patient on the date of this encounter to include pre-visit review of records, face-to-face time with the patient and post visit documentation. Additional time was spent on the pessary fitting.

## 2021-03-13 NOTE — Telephone Encounter (Signed)
Pt was contacted and has been scheduled for 03/14/2021 @ 9:20am

## 2021-03-14 ENCOUNTER — Ambulatory Visit (INDEPENDENT_AMBULATORY_CARE_PROVIDER_SITE_OTHER): Payer: 59 | Admitting: Obstetrics and Gynecology

## 2021-03-14 ENCOUNTER — Encounter: Payer: Self-pay | Admitting: Obstetrics and Gynecology

## 2021-03-14 ENCOUNTER — Other Ambulatory Visit: Payer: Self-pay

## 2021-03-14 VITALS — BP 128/78 | HR 108

## 2021-03-14 DIAGNOSIS — N8110 Cystocele, unspecified: Secondary | ICD-10-CM

## 2021-03-14 DIAGNOSIS — N812 Incomplete uterovaginal prolapse: Secondary | ICD-10-CM | POA: Diagnosis not present

## 2021-03-14 DIAGNOSIS — N393 Stress incontinence (female) (male): Secondary | ICD-10-CM

## 2021-03-16 ENCOUNTER — Encounter: Payer: Self-pay | Admitting: Physical Therapy

## 2021-03-16 ENCOUNTER — Other Ambulatory Visit: Payer: Self-pay

## 2021-03-16 ENCOUNTER — Encounter: Payer: 59 | Attending: Family Medicine | Admitting: Physical Therapy

## 2021-03-16 DIAGNOSIS — R278 Other lack of coordination: Secondary | ICD-10-CM | POA: Insufficient documentation

## 2021-03-16 DIAGNOSIS — M6281 Muscle weakness (generalized): Secondary | ICD-10-CM | POA: Diagnosis present

## 2021-03-16 NOTE — Patient Instructions (Addendum)
About Pelvic Support Problems Pelvic Support Problems Explained Ligaments, muscles, and connective tissue normally hold your bladder, uterus, and other organs in their proper places in your pelvis. When these tissues become weak, a problem with pelvic support may result. Weak support can cause one or more of the pelvic organs to drop down into the vagina. An organ may even drop so far that is partially exposed outside the body.  Pelvic support problems are named by the change in the organ. The main types of pelvic support problems are:  Cystocele: When the bladder drops down into your vagina.  Enterocele: When your small intestine drops between your vagina and rectum.  Rectocele: When your rectum bulges into the vaginal wall.  Uterine prolapse: When your uterus drops into your vagina.  Vaginal prolapse: When the top part of the vagina begins to droop. This sometimes happens after a hysterectomy (removal of the uterus).  Causes Pelvic support problems can be caused by many conditions. They may begin after you give birth, especially if you had a large baby. During childbirth, the muscles and skin of the birth canal (vagina) are stretched and sometimes torn. They heal over time but are not always exactly the same. A long pushing stage of labor may also weaken these tissues as well as very rapid births as the tissues do not have time to stretch so they tear.  Also, after menopause, there are changes in the vaginal walls resulting from a decrease in estrogen. Estrogen helps to keep the tissues toned. Low levels of estrogen weaken the vaginal walls and may cause the bladder to shift from its normal position. As women get older, the loss of muscle tone and the relaxation of muscles may cause the uterus or other organs to drop.  Over time, conditions like chronic coughing, chronic constipation, doing a lot of heavy lifting, straining to pass stool, and obesity, can also weaken the pelvic support muscles.   Diagnosing Pelvic Support Problems Your health care provider will ask about your symptoms and do a pelvic examination. Your provider may also do a rectal exam during your pelvic exam. Your provider may ask you to: 1. Bear down and push (like you are having a bowel movement) so he or she can see if your bladder or other part of your body protrudes into the vagina. 2. Contract the muscles of your pelvis to check the strength of your pelvic muscles.  3. Do several types of urine, nerve and muscle tests of the pelvis and around the bladder to see what type of treatment is best for you.   Symptoms Symptoms of pelvic support problems depend on the organ involved, but may include:  urine leakage  stain or fecal loss after a bowel movement trouble having bowel movements  ache in the lower abdomen, groin, or lower back  bladder infection  a feeling of heaviness, pulling, or fullness in the pelvis, or a feeling that something is falling out of the vagina  an organ protruding from your vaginal opening  feeling the need to support the organs or perineal area to empty bladder or bowels painful sexual intercourse.  Many women feel pelvic pressure or trouble holding their urine immediately after childbirth. For some, these symptoms go away permanently, in others they return as they get older.  Treatment Options A prolapsed organ cannot repair itself. Contact your health care provider as soon as you notice symptoms of a problem. Treatment depends on what the specific problem is and how far  advanced it is.  The symptoms caused by some pelvic support problems may simply be treated with changes in diet, medicine to soften the stool, weight loss, or avoiding strenuous activities. You may also do pelvic floor exercises to help strengthen your pelvic muscles.  Some cases of prolapse may require a special support device made from plastic or rubber called a pessary that fits into the vagina to support the uterus,  vagina, or bladder. A pessary can also help women who leak urine when coughing, straining, or exercising. In mild cases, a tampon or vaginal diaphragm may be used instead of a pessary.  Talk to your doctor or health care provider about these options. In serious cases, surgery may be needed to put the organs back into their proper place. The uterus may be removed because of the pressure it puts on the bladder.  Your doctor will know what surgery will be best for you. How can I prevent pelvic support problems?  You can help prevent pelvic support problems by:  maintaining a healthy lifestyle  continuing to do pelvic floor exercises after you deliver a baby  maintaining a healthy weight  avoiding a lot of heavy lifting and lifting with your legs (not from your waist)  treating constipation and avoid getting   Ways to protect prolapse Quitting smoking. Treating conditions that might put strain on the pelvic floor, such as a chronic cough or constipation. Losing weight. Strengthening your core and your pelvic floor. Avoiding heavy lifting, pushing, pulling, and bending When lifting using proper body mechanics Not straining during bowel movements. High fiber diet, 30 g, to reduce chance of constipation Drink 6-8 glasses of water per day Avoid being on your feet for long periods of time Middle of the day lay on your back with your feet propped up for 5-10 minutes  About Cystocele Overview The pelvic organs, including the bladder, are normally supported by pelvic floor muscles and ligaments.   When these muscles and ligaments are stretched, weakened or torn, the wall between the bladder and the vagina sags or herniates causing a prolapse, sometimes called a cystocele. This condition may cause discomfort and problems with emptying the bladder.  It can be present in various stages.  Some people are not aware of the changes. Others may notice changes at the vaginal opening or a feeling of the bladder  dropping outside the body. Causes of a Cystocele A cystocele is usually caused by muscle straining or stretching during childbirth.  In addition, cystocele is more common after menopause, because the hormone estrogen helps keep the elastic tissues around the pelvic organs strong.  A cystocele is more likely to occur when levels of estrogen decrease. Other causes include: heavy lifting, chronic coughing, previous pelvic surgery and obesity.  Symptoms A bladder that has dropped from its normal position may cause: unwanted urine leakage (stress incontinence), frequent urination or urge to urinate, incomplete emptying of the bladder (not feeling bladder relief after emptying), pain or discomfort in the vagina, pelvis, groin, lower back or lower abdomen and frequent urinary tract infections.  Mild cases may not cause any symptoms.  Treatment Options Pelvic floor (Kegel) exercises: Strength training the muscles in your genital area  Behavioral changes: Treating and preventing constipation, taking time to empty your bladder properly, learning to lift properly and/or  avoid heavy lifting when possible, stopping smoking, avoiding weight gain and treating a chronic cough or bronchitis. A pessary: A vaginal support device is sometimes used to help pelvic support  caused by muscle and ligament changes. Surgery: Aurgical repair may be necessary if symptoms cannot be managed with exercise, behavioral changes and a pessary. Surgery is usually considered for severe cases.  Access Code: [email or web address] URL: [email or web address] Date: 03/16/2021 Prepared by: Earlie Counts  Exercises Supine Pelvic Floor Contraction - 3 x daily - 7 x weekly - 1 sets - 10 reps - 5 sec hold   Earlie Counts, PT [facility] [address], [place], [place] [address] W: [phone] [email or web address]

## 2021-03-16 NOTE — Therapy (Signed)
[place] at [facility] [address] [place], [place], [address] Phone: [phone]   Fax:  [phone]  Physical Therapy Evaluation  Patient Details  Name: Nicole Cantu MRN: [id number] Date of Birth: [birth year] Referring Provider (PT): Dr. Sherlene Shams   Encounter Date: 03/16/2021   PT End of Session - 03/16/21 1328     Visit Number 1    Date for PT Re-Evaluation 06/08/21    Authorization Time Period [facility]    Authorization - Number of Visits 60    PT Start Time 1400    PT Stop Time 1450    PT Time Calculation (min) 50 min    Activity Tolerance Patient tolerated treatment well    Behavior During Therapy [facility] for tasks assessed/performed             Past Medical History:  Diagnosis Date   Anxiety    Arrhythmia    Holter 01/26/2008-HR [id number] rare PVC .pac No adverse arrhythmia   Arthritis    Back pain    Benign intracranial hypertension    Dr Jannifer Franklin    Chest pain    NUC stress - no ischemia   Colon polyps    Fatigue    Hyperlipidemia    Hypertension    Pulsatile tinnitus of both ears     Past Surgical History:  Procedure Laterality Date   COLONOSCOPY     WISDOM TOOTH EXTRACTION     about 15 years ago    There were no vitals filed for this visit.    Subjective Assessment - 03/16/21 1305     Subjective Patietn reports 2.5 years ago she felt a bulge. This past summer the bulge got worse. She started to see Dr. Wannetta Sender last November and has been using a pessary. Since she started to use the pessary the urinary leakage has gotten worse.    Patient Stated Goals reduce leakage, strengthen to assist with prolapse    Currently in Pain? No/denies    Multiple Pain Sites No                [facility] PT Assessment - 03/16/21 0001       Assessment   Medical Diagnosis N39.3 SUI    Referring Provider (PT) Dr. Sherlene Shams    Onset Date/Surgical Date --   2.5 years ago   Prior Therapy none       Precautions   Precautions None      Restrictions   Weight Bearing Restrictions No      Balance Screen   Has the patient fallen in the past 6 months No    Has the patient had a decrease in activity level because of a fear of falling?  No    Is the patient reluctant to leave their home because of a fear of falling?  No      Home [employer] residence      Prior [place] [employer]ired    Leisure swimming 30 minutes, back exercises, gardening      Cognition   Overall Cognitive Status Within Functional Limits for tasks assessed      Posture/Postural Control   Posture/Postural Control No significant limitations      ROM / Strength   AROM / PROM / Strength AROM;PROM;Strength      AROM   Overall AROM Comments lumbar ROM is limited by 25% except for flexion is full      Strength  Overall Strength Comments weakness in the lower abdominals    Right Hip ABduction 4/5    Left Hip ABduction 5/5      Palpation   Palpation comment tenderness located in right upper quadrant and left lower quadrant                        Objective measurements completed on examination: See above findings.     Pelvic Floor Special Questions - 03/16/21 0001     Prior Pregnancies No    Currently Sexually Active No    Urinary Leakage Yes   small dribbles   Pad use Poise pads and 2 pads and change pad even if a drop of urine    Activities that cause leaking With strong urge;Other;Coughing;Sneezing;Laughing;Lifting;Bending    Other activities that cause leaking as she walks to the bathroom, sitting to standing    Urinary urgency Yes    Fecal incontinence No    Falling out feeling (prolapse) Yes    Skin Integrity Intact    Pelvic Floor Internal Exam Patient confirms identificaton and approves PT to assess pelvic floor and treatment    Exam Type Vaginal    Palpation tightness in the urethrap sphincter, perineal body and introitus    Strength weak  squeeze, no lift                       PT Education - 03/16/21 1350     Education Details education on prolapse management and what is a prolapse;Access Code: [email or web address]    Person(s) Educated Patient    Methods Explanation;Demonstration;Verbal cues;Handout    Comprehension Returned demonstration;Verbalized understanding              PT Short Term Goals - 03/16/21 1516       PT SHORT TERM GOAL #1   Title independent with initial HEP for pelvic floor strengthening in supine    Time 4    Period Weeks    Status New    Target Date 04/13/21      PT SHORT TERM GOAL #2   Title understands ways to manage prolapse during the day to reduce chance of prolapse from progressing    Time 4    Period Weeks    Status New    Target Date 04/13/21               PT Long Term Goals - 03/16/21 1517       PT LONG TERM GOAL #1   Title independent with advanced HEP for pelvic floor and core strength without aggravating her back or left shoulder    Time 12    Period Weeks    Status New    Target Date 06/08/21      PT LONG TERM GOAL #2   Title understand pressure management to reduce strain on the pelvic floor with lifting, daily tasks and gardening    Time 12    Period Weeks    Status New    Target Date 06/08/21      PT LONG TERM GOAL #3   Title pelvic floor strength >/= 3/5 to reduce her urinary leakage >/= 75% during daily tasks    Time 12    Period Weeks    Status New    Target Date 06/08/21      PT LONG TERM GOAL #4   Title wears 1 thin pad daily for just in case due to reduction  of daily leakage.    Time 12    Period Weeks    Status New    Target Date 06/08/21                    Plan - 03/16/21 1352     Clinical Impression Statement Patient is a 66 year old female with stress incontinence and prolapse for the past 2.5 years. She reports her urinary leakage has become worse since this past summer. She is presently using a pessary. Patient  reports she will leak urine with conughihg, laughing, sneezing, walking, , lifting, and bending. Patient has started to use Poise pads and will change them if she has a drop of urine on them. Pelvic floor strength is 2/5. She has tightness along the introitus, perineal body, and bilateral urehtra sphincter. When she contracts the pelvic floor she will raise her chest and hold her breath. She has difficulty with contracting the lower abdominals. 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 Patient has a cyctocele and uterine prolapse that is a grade 2. Patient will benefit from skilled therapy to reduce her leakage and strengthen her pelvic floor.    Personal Factors and Comorbidities Fitness;Past/Current Experience    Examination-Activity Limitations Continence;Lift;Carry;Bend    Examination-Participation Restrictions Community Activity    Stability/Clinical Decision Making Stable/Uncomplicated    Clinical Decision Making Low    Rehab Potential Excellent    PT Frequency 1x / week    PT Duration 12 weeks    PT Treatment/Interventions ADLs/Self Care Home Management;Biofeedback;Therapeutic activities;Therapeutic exercise;Neuromuscular re-education;Patient/family education;Manual techniques    PT Next Visit Plan work on pelvic floor muscles to improve contraction, abdominal strength, quick flicks, review ways to manage prolapse, breahting with lifting    PT Home Exercise Plan Access Code: [email or web address]    Consulted and Agree with Plan of Care Patient             Patient will benefit from skilled therapeutic intervention in order to improve the following deficits and impairments:  Decreased endurance, Decreased activity tolerance, Decreased strength, Increased fascial restricitons, Decreased coordination  Visit Diagnosis: Muscle weakness (generalized) - Plan: PT plan of care cert/re-cert  Other lack of coordination - Plan: PT plan of care  cert/re-cert     Problem List There are no problems to display for this patient.   Earlie Counts, PT 03/16/21 3:22 PM  [place] [address] [place], [place], [address] Phone: [phone]   Fax:  [phone]  Name: Nicole Cantu MRN: [id number] Date of Birth: [birth year]

## 2021-03-23 ENCOUNTER — Ambulatory Visit: Payer: Self-pay | Admitting: Physical Therapy

## 2021-03-23 ENCOUNTER — Encounter: Payer: Self-pay | Admitting: Physical Therapy

## 2021-03-23 ENCOUNTER — Other Ambulatory Visit: Payer: Self-pay

## 2021-03-23 DIAGNOSIS — M6281 Muscle weakness (generalized): Secondary | ICD-10-CM | POA: Diagnosis not present

## 2021-03-23 DIAGNOSIS — R278 Other lack of coordination: Secondary | ICD-10-CM

## 2021-03-23 NOTE — Therapy (Signed)
[place] at [facility] [address] [place], [place], [address] Phone: [phone]   Fax:  [phone]  Physical Therapy Treatment  Patient Details  Name: Nicole Cantu MRN: [id number] Date of Birth: [birth year] Referring Provider (PT): Dr. Sherlene Shams   Encounter Date: 03/23/2021   PT End of Session - 03/23/21 1308     Visit Number 2    Date for PT Re-Evaluation 06/08/21    Authorization Time [facility]    Authorization - Visit Number 2    Authorization - Number of Visits 60    PT Start Time 1300    PT Stop Time 1345    PT Time Calculation (min) 45 min    Activity Tolerance Patient tolerated treatment well    Behavior During Therapy [facility] for tasks assessed/performed             Past Medical History:  Diagnosis Date   Anxiety    Arrhythmia    Holter 01/26/2008-HR [id number] rare PVC .pac No adverse arrhythmia   Arthritis    Back pain    Benign intracranial hypertension    Dr Jannifer Franklin    Chest pain    NUC stress - no ischemia   Colon polyps    Fatigue    Hyperlipidemia    Hypertension    Pulsatile tinnitus of both ears     Past Surgical History:  Procedure Laterality Date   COLONOSCOPY     WISDOM TOOTH EXTRACTION     about 15 years ago    There were no vitals filed for this visit.   Subjective Assessment - 03/23/21 1304     Subjective I have been working on the [employer] .    Patient Stated Goals reduce leakage, strengthen to assist with prolapse    Currently in Pain? No/denies    Multiple Pain Sites No                            Pelvic Floor Special Questions - 03/23/21 0001     Pelvic Floor Internal Exam Patient confirms identificaton and approves PT to assess pelvic floor and treatment    Exam Type Vaginal    Strength weak squeeze, no lift               [facility] Adult PT Treatment/Exercise - 03/23/21 0001       Neuro Re-ed    Neuro Re-ed Details  tactile cues to  the pelvic floor muscles to assit with contraction and using her breath      Lumbar Exercises: Supine   Ab Set 10 reps   with pelvic floor contraction   AB Set Limitations supine with hips on 2 pillows    Clam 10 reps;1 second   with pelvic floor contraction   Clam Limitations laying with hips on 2 pillows    Other Supine Lumbar Exercises supine with squeezing a yoga blodk and hips on 2 pillows and contracting the pelvic floor      Manual Therapy   Manual Therapy Internal Pelvic Floor    Internal Pelvic Floor manual work to the introitus, perineal body and urethra sphincter to elongate the tissue to improve contraction                     PT Education - 03/23/21 1353     Education Details Access Code: [email or web address]    Person(s) Educated Patient    Methods Explanation;Demonstration;Verbal cues;Handout  Comprehension Returned demonstration;Verbalized understanding              PT Short Term Goals - 03/23/21 1400       PT SHORT TERM GOAL #1   Title independent with initial HEP for pelvic floor strengthening in supine    Time 4    Period Weeks    Status On-going      PT SHORT TERM GOAL #2   Title understands ways to manage prolapse during the day to reduce chance of prolapse from progressing    Time 4    Period Weeks    Status On-going               PT Long Term Goals - 03/16/21 1517       PT LONG TERM GOAL #1   Title independent with advanced HEP for pelvic floor and core strength without aggravating her back or left shoulder    Time 12    Period Weeks    Status New    Target Date 06/08/21      PT LONG TERM GOAL #2   Title understand pressure management to reduce strain on the pelvic floor with lifting, daily tasks and gardening    Time 12    Period Weeks    Status New    Target Date 06/08/21      PT LONG TERM GOAL #3   Title pelvic floor strength >/= 3/5 to reduce her urinary leakage >/= 75% during daily tasks    Time 12    Period Weeks     Status New    Target Date 06/08/21      PT LONG TERM GOAL #4   Title wears 1 thin pad daily for just in case due to reduction of daily leakage.    Time 12    Period Weeks    Status New    Target Date 06/08/21                   Plan - 03/23/21 1309     Clinical Impression Statement Patient pelvic floor strength is 2/5 still. She has improved tissue mobility  of the introitus. Patient is learning how to use her breath and leg movements to engage the pelvic floor. She continues to need tactile cues to assit the muscles. Patient has learned how to reset the prolapse by laying onher back with 2 pillows under her hips. Patient back felt good with the exercises. Patient has learned how to [facility] elongate the tissue with self massage. Patient will benefit from skilled therapy to reduce her leakage and strengthen her pelvic floor.    Personal Factors and Comorbidities Fitness;Past/Current Experience    Examination-Activity Limitations Continence;Lift;Carry;Bend    Examination-Participation Restrictions Community Activity    Stability/Clinical Decision Making Stable/Uncomplicated    Rehab Potential Excellent    PT Frequency 1x / week    PT Duration 12 weeks    PT Treatment/Interventions ADLs/Self Care Home Management;Biofeedback;Therapeutic activities;Therapeutic exercise;Neuromuscular re-education;Patient/family education;Manual techniques    PT Next Visit Plan work on pelvic floor muscles to improve contraction,  review ways to manage prolapse, breahting with lifting; try with bridge and bridge with knees in andout    PT Home Exercise Plan Access Code: [email or web address]    Recommended Other Services MD signed initial note    Consulted and Agree with Plan of Care Patient             Patient will benefit from skilled therapeutic intervention in order  to improve the following deficits and impairments:  Decreased endurance, Decreased activity tolerance, Decreased strength, Increased fascial  restricitons, Decreased coordination  Visit Diagnosis: Muscle weakness (generalized)  Other lack of coordination     Problem List There are no problems to display for this patient.   Earlie Counts, PT 03/23/21 2:01 PM  [place] [facility] at [facility] [address], [place], [place], [address] Phone: [phone]   Fax:  [phone]  Name: Indianna Boran MRN: [id number] Date of Birth: 08/21/1955

## 2021-03-23 NOTE — Patient Instructions (Signed)
Access Code: [email or web address] URL: [email or web address] Date: 03/23/2021 Prepared by: Earlie Counts  Program Notes lay on back with 2 pillows under hips, pillows under knees like a butterfly for 10 minutes.  massage the sides of the vaginal canal with 20 strokes on each side every other day.    Exercises Supine Pelvic Floor Contraction - 3 x daily - 7 x weekly - 1 sets - 10 reps - 5 sec hold Supine Hip Adduction Isometric with Ball - 1 x daily - 7 x weekly - 1 sets - 5 reps Bent Knee Fallouts - 1 x daily - 7 x weekly - 1 sets - 10 reps Earlie Counts, PT [facility] [address] [place], [place] [address] W: [phone] [email or web address]

## 2021-03-28 ENCOUNTER — Other Ambulatory Visit: Payer: Self-pay

## 2021-03-28 ENCOUNTER — Encounter: Payer: 59 | Admitting: Physical Therapy

## 2021-03-28 ENCOUNTER — Encounter: Payer: Self-pay | Admitting: Physical Therapy

## 2021-03-28 DIAGNOSIS — R278 Other lack of coordination: Secondary | ICD-10-CM

## 2021-03-28 DIAGNOSIS — M6281 Muscle weakness (generalized): Secondary | ICD-10-CM | POA: Diagnosis not present

## 2021-03-28 NOTE — Therapy (Signed)
[place] at [facility] [address] [place], [place], [address] Phone: [phone]   Fax:  [phone]  Physical Therapy Treatment  Patient Details  Name: Nicole Cantu MRN: [id number] Date of Birth: [birth year] Referring Provider (PT): Dr. Sherlene Shams   Encounter Date: 03/28/2021   PT End of Session - 03/28/21 1530     Visit Number 3    Date for PT Re-Evaluation 06/08/21    Authorization Time [place]    Authorization - Visit Number 3    Authorization - Number of Visits 60    PT Start Time 1500    PT Stop Time 1545    PT Time Calculation (min) 45 min    Activity Tolerance Patient tolerated treatment well    Behavior During Therapy [facility] for tasks assessed/performed             Past Medical History:  Diagnosis Date   Anxiety    Arrhythmia    Holter 01/26/2008-HR [id number].[id number] rare PVC .pac No adverse arrhythmia   Arthritis    Back pain    Benign intracranial hypertension    Dr Jannifer Franklin    Chest pain    NUC stress - no ischemia   Colon polyps    Fatigue    Hyperlipidemia    Hypertension    Pulsatile tinnitus of both ears     Past Surgical History:  Procedure Laterality Date   COLONOSCOPY     WISDOM TOOTH EXTRACTION     about 15 years ago    There were no vitals filed for this visit.   Subjective Assessment - 03/28/21 1457     Subjective I have troubel with the sequence of the exercise. The leakage feels better. The pessary is comfortable.    Patient Stated Goals reduce leakage, strengthen to assist with prolapse    Currently in Pain? No/denies    Multiple Pain Sites No                            Pelvic Floor Special Questions - 03/28/21 0001     Pelvic Floor Internal Exam Patient confirms identificaton and approves PT to assess pelvic floor and treatment    Exam Type Vaginal    Strength fair squeeze, definite lift   weakn lift, feel best with 2 finger insertion               [facility] Adult PT Treatment/Exercise - 03/28/21 0001       Neuro Re-ed    Neuro Re-ed Details  tactile cues to the pevic floor muscles to work on pelvic floro onctraciton with lower abodminal contraction      Lumbar Exercises: Supine   Clam 10 reps;1 second   with pelvic floor contraction   Clam Limitations laying with hips on 2 pillows; press back inot therapist hands to prevent see saw motion    Other Supine Lumbar Exercises supine with squeezing a yoga blodk and hips on 2 pillows and contracting the pelvic floor with verbal cues to not lift buttocks, squeeze block gently, keep back flat      Knee/Hip Exercises: Standing   Other Standing Knee Exercises standing hip hinge with keeping spinal neutram and distance between breast bone and pubic bone and contracting the pelvic floor 20x      Manual Therapy   Manual Therapy Internal Pelvic Floor;Myofascial release    Myofascial Release release of the urogenital diaphragm going through the  planes of restrictions    Internal Pelvic Floor manual work to the urethra spincter and along the sides of the introitus                       PT Short Term Goals - 03/28/21 1530       PT SHORT TERM GOAL #1   Title independent with initial HEP for pelvic floor strengthening in supine    Time 4    Period Weeks    Status Achieved    Target Date 04/13/21      PT SHORT TERM GOAL #2   Title understands ways to manage prolapse during the day to reduce chance of prolapse from progressing    Time 4    Period Weeks    Status On-going               PT Long Term Goals - 03/16/21 1517       PT LONG TERM GOAL #1   Title independent with advanced HEP for pelvic floor and core strength without aggravating her back or left shoulder    Time 12    Period Weeks    Status New    Target Date 06/08/21      PT LONG TERM GOAL #2   Title understand pressure management to reduce strain on the pelvic floor with lifting, daily tasks and  gardening    Time 12    Period Weeks    Status New    Target Date 06/08/21      PT LONG TERM GOAL #3   Title pelvic floor strength >/= 3/5 to reduce her urinary leakage >/= 75% during daily tasks    Time 12    Period Weeks    Status New    Target Date 06/08/21      PT LONG TERM GOAL #4   Title wears 1 thin pad daily for just in case due to reduction of daily leakage.    Time 12    Period Weeks    Status New    Target Date 06/08/21                   Plan - 03/28/21 1530     Clinical Impression Statement Patient feels she has some reduction in leakage. She needs verbal cues to hinge at her hips and not flex at her thoracic spine putting pressure on the prolapse. Patient had some restrictions in the pelvic floor but after manual work was able to contract with a slight lift for first time. She does not bulge her lower abdomen with pelvic floor contracton in supine. Patient is working hard on her HEP. Patient will benefit from skilled therapy to reduce her leakage and strengthen her pelvic floor.    Personal Factors and Comorbidities Fitness;Past/Current Experience    Examination-Activity Limitations Continence;Lift;Carry;Bend    Examination-Participation Restrictions Community Activity    Stability/Clinical Decision Making Stable/Uncomplicated    Rehab Potential Excellent    PT Frequency 1x / week    PT Duration 12 weeks    PT Treatment/Interventions ADLs/Self Care Home Management;Biofeedback;Therapeutic activities;Therapeutic exercise;Neuromuscular re-education;Patient/family education;Manual techniques    PT Next Visit Plan see how hip hinging is, go over lifting with breath and keeping her ribs over pelvis,  review ways to manage prolapse, breahting with lifting; try with bridge and bridge with knees in and out    PT Home Exercise Plan Access Code: [email or web address]    Consulted and Agree with  Plan of Care Patient             Patient will benefit from skilled therapeutic  intervention in order to improve the following deficits and impairments:  Decreased endurance, Decreased activity tolerance, Decreased strength, Increased fascial restricitons, Decreased coordination  Visit Diagnosis: Muscle weakness (generalized)  Other lack of coordination     Problem List There are no problems to display for this patient.   Earlie Counts, PT 03/28/21 4:04 PM  [place] [facility] at [facility] [address], [place], [place], [address] Phone: [phone]   Fax:  [phone]  Name: Nicole Cantu MRN: [id number] Date of Birth: 12/12/1955

## 2021-04-11 ENCOUNTER — Encounter: Payer: Self-pay | Admitting: Physical Therapy

## 2021-04-11 ENCOUNTER — Other Ambulatory Visit: Payer: Self-pay

## 2021-04-11 ENCOUNTER — Encounter: Payer: 59 | Attending: Obstetrics and Gynecology | Admitting: Physical Therapy

## 2021-04-11 DIAGNOSIS — R278 Other lack of coordination: Secondary | ICD-10-CM | POA: Insufficient documentation

## 2021-04-11 DIAGNOSIS — M6281 Muscle weakness (generalized): Secondary | ICD-10-CM | POA: Diagnosis not present

## 2021-04-11 NOTE — Patient Instructions (Signed)
Access Code: [email or web address] URL: [email or web address] Date: 04/11/2021 Prepared by: Earlie Counts  Program Notes lay on back with 2 pillows under hips, pillows under knees like a butterfly for 10 minutes.  massage the sides of the vaginal canal with 20 strokes on each side every other day.    Exercises Supine Pelvic Floor Contraction - 3 x daily - 7 x weekly - 1 sets - 10 reps - 5 sec hold Supine Hip Adduction Isometric with Ball - 1 x daily - 7 x weekly - 1 sets - 5 reps Bent Knee Fallouts - 1 x daily - 7 x weekly - 1 sets - 10 reps Standing Hip Hinge with Dowel - 1 x daily - 3 x weekly - 1 sets - 10 reps Modified Bird Dog At Wall - 1 x daily - 3 x weekly - 2 sets - 10 reps Earlie Counts, PT [facility] [place] [address] [place], [place] [address] W: [phone] [email or web address]

## 2021-04-11 NOTE — Therapy (Signed)
[place] at [facility] [address] [place], [place], [address] Phone: [phone]   Fax:  [phone]  Physical Therapy Treatment  Patient Details  Name: Nicole Cantu MRN: [id number] Date of Birth: 08-30-1955 Referring Provider (PT): Dr. Sherlene Shams   Encounter Date: 04/11/2021   PT End of Session - 04/11/21 1136     Visit Number 4    Date for PT Re-Evaluation 06/08/21    Authorization Time [place]    Authorization - Visit Number 4    Authorization - Number of Visits 60    PT Start Time 1130    PT Stop Time 1215    PT Time Calculation (min) 45 min    Activity Tolerance Patient tolerated treatment well    Behavior During Therapy [facility] for tasks assessed/performed             Past Medical History:  Diagnosis Date   Anxiety    Arrhythmia    Holter 01/26/2008-HR [id number] rare PVC .pac No adverse arrhythmia   Arthritis    Back pain    Benign intracranial hypertension    Dr Jannifer [place]    Chest pain    NUC stress - no ischemia   Colon polyps    Fatigue    Hyperlipidemia    Hypertension    Pulsatile tinnitus of both ears     Past Surgical History:  Procedure Laterality Date   COLONOSCOPY     WISDOM TOOTH EXTRACTION     about 15 years ago    There were no vitals filed for this visit.   Subjective Assessment - 04/11/21 1131     Subjective I feel like I slide backward a little. I feel a little bit of leakage with bending over  and reaching. I do the exercises daily. the prolapse feels the same. I see Dr. Wannetta Sender on Thursday. Sometimes I feel a  twinge in the bladder.    Patient Stated Goals reduce leakage, strengthen to assist with prolapse    Currently in Pain? No/denies    Multiple Pain Sites No                            Pelvic Floor Special Questions - 04/11/21 0001     Pelvic Floor Internal Exam Patient confirms identificaton and approves PT to assess pelvic floor and  treatment    Exam Type Vaginal    Palpation tightness on the left side of the bladder and around the cervix    Strength fair squeeze, definite lift               [facility] Adult PT Treatment/Exercise - 04/11/21 0001       Knee/Hip Exercises: Standing   Other Standing Knee Exercises bird dog against the wallwith pelvic floor contraction and not letting her trunk lean to the side and over extens her back      Manual Therapy   Manual Therapy Internal Pelvic Floor    Internal Pelvic Floor mobilization of the anterio and sides of the cervix, alon gthe left side of the bladder, along the urethra with one finger on the ubilicus                     PT Education - 04/11/21 1214     Education Details Access Code: [email or web address]    Person(s) Educated Patient    Methods Explanation;Demonstration;Handout    Comprehension Verbalized understanding;Returned demonstration  PT Short Term Goals - 04/11/21 1137       PT SHORT TERM GOAL #1   Title independent with initial HEP for pelvic floor strengthening in supine    Time 4    Period Weeks    Status Achieved    Target Date 04/13/21      PT SHORT TERM GOAL #2   Title understands ways to manage prolapse during the day to reduce chance of prolapse from progressing    Time 4    Period Weeks    Status Achieved               PT Long Term Goals - 03/16/21 1517       PT LONG TERM GOAL #1   Title independent with advanced HEP for pelvic floor and core strength without aggravating her back or left shoulder    Time 12    Period Weeks    Status New    Target Date 06/08/21      PT LONG TERM GOAL #2   Title understand pressure management to reduce strain on the pelvic floor with lifting, daily tasks and gardening    Time 12    Period Weeks    Status New    Target Date 06/08/21      PT LONG TERM GOAL #3   Title pelvic floor strength >/= 3/5 to reduce her urinary leakage >/= 75% during daily tasks    Time 12     Period Weeks    Status New    Target Date 06/08/21      PT LONG TERM GOAL #4   Title wears 1 thin pad daily for just in case due to reduction of daily leakage.    Time 12    Period Weeks    Status New    Target Date 06/08/21                   Plan - 04/11/21 1136     Clinical Impression Statement Patient has fascial restrions on the left side of the cervix and anteriorly that were released. She had restrictions on the left side of the bladder.She took out  her pessary so the therapist could perform manual work.  After manual work her lower abdomen felt released. Pelvic lfoor strength is 3/5 with a good circular contraciton and medium lift. She will leak urine wiht reaching overhead and bending forward. Patient will benefit from skilled therapy to improve pelvic floor coordination to reduce leakage.    Personal Factors and Comorbidities Fitness;Past/Current Experience    Examination-Activity Limitations Continence;Lift;Carry;Bend    Examination-Participation Restrictions Community Activity    Stability/Clinical Decision Making Stable/Uncomplicated    Rehab Potential Excellent    PT Frequency 1x / week    PT Duration 12 weeks    PT Treatment/Interventions ADLs/Self Care Home Management;Biofeedback;Therapeutic activities;Therapeutic exercise;Neuromuscular re-education;Patient/family education;Manual techniques    PT Next Visit Plan See if there was a difference in manual work; work on pelvic floor contraction with leaning forward    PT Home Exercise Plan Access Code: [email or web address]    Consulted and Agree with Plan of Care Patient             Patient will benefit from skilled therapeutic intervention in order to improve the following deficits and impairments:  Decreased endurance, Decreased activity tolerance, Decreased strength, Increased fascial restricitons, Decreased coordination  Visit Diagnosis: Muscle weakness (generalized)  Other lack of coordination     Problem  List There are  no problems to display for this patient.   Earlie Counts, PT 04/11/21 12:25 PM   [facility] at [facility] [address], [place], [place], [address] Phone: [phone]   Fax:  [phone]  Name: Nicole Cantu MRN: [id number] Date of Birth: 11-14-1955

## 2021-04-13 ENCOUNTER — Ambulatory Visit (INDEPENDENT_AMBULATORY_CARE_PROVIDER_SITE_OTHER): Payer: 59 | Admitting: Obstetrics and Gynecology

## 2021-04-13 ENCOUNTER — Encounter: Payer: Self-pay | Admitting: Obstetrics and Gynecology

## 2021-04-13 ENCOUNTER — Other Ambulatory Visit: Payer: Self-pay

## 2021-04-13 VITALS — BP 121/79 | HR 90

## 2021-04-13 DIAGNOSIS — N3281 Overactive bladder: Secondary | ICD-10-CM | POA: Diagnosis not present

## 2021-04-13 MED ORDER — SOLIFENACIN SUCCINATE 5 MG PO TABS: 5.0000 mg | ORAL_TABLET | Freq: Every day | ORAL | 5 refills | Status: DC

## 2021-04-13 NOTE — Progress Notes (Signed)
[facility]   Subjective:     Chief Complaint:  Chief Complaint  Patient presents with   Pessary Check   History of Present Illness: Franziska Podgurski is a 66 y.o. female with stage II pelvic organ prolapse and stress incontinence who presents for a pessary check. She is using a size #4 shaatz pessary. She does not have the pessary in place currently. Overall has been working well for her to keep the prolapse in place. But, she has developed more leakage with the pessary in place so has taken it out temporarily. The leakage does not happen all the time. She notices a twinge of an urge then will leak, not necessarily with movement as it was before.   Has started with physical therapy. Feels she is seeing good improvement in her symptoms.   Past Medical History: Patient  has a past medical history of Anxiety, Arrhythmia, Arthritis, Back pain, Benign intracranial hypertension, Chest pain, Colon polyps, Fatigue, Hyperlipidemia, Hypertension, and Pulsatile tinnitus of both ears.   Past Surgical History: She  has a past surgical history that includes Colonoscopy and Wisdom tooth extraction.   Medications: She has a current medication list which includes the following prescription(s): alprazolam, estradiol, lisinopril-hydrochlorothiazide, simvastatin, solifenacin, turmeric, and zolpidem.   Allergies: Patient has No Known Allergies.   Social History: Patient  reports that she has never smoked. She has never used smokeless tobacco. She reports current alcohol use of about 1.0 standard drink per week. She reports that she does not use drugs.      Objective:    Physical Exam: BP 121/79    Pulse 90  Gen: No apparent distress, A&O x 3. Detailed Urogynecologic Evaluation:  deferred   POP-Q (12/28/20):    POP-Q   1                                            Aa   1                                           Ba   1.5                                              C    4                                             Gh   5                                            Pb   6                                            tvl    -2  Ap   -2                                            Bp   -4.5                                              D        Assessment/Plan:    Assessment: Ms. Kam is a 66 y.o. with stage II pelvic organ prolapse , SUI and OAB  Plan: - Continue #4 shaatz pessary but keep in place less often- only when going out or more active.  - Will start medication for OAB symptoms- Vesicare 5mg  daily. We discussed side effects of dry mouth, dry eyes or constipation, or cognitive dysfunction with long term use.  - Continue vaginal estrogen twice a week - For SUI, continue with pelvic physical therapy.  - If she does not see improvement, she will consider surgery.   Return 2 months for follow up  Jaquita Folds, MD

## 2021-04-18 ENCOUNTER — Encounter: Payer: Self-pay | Admitting: Physical Therapy

## 2021-04-18 ENCOUNTER — Other Ambulatory Visit: Payer: Self-pay

## 2021-04-18 ENCOUNTER — Encounter: Payer: 59 | Admitting: Physical Therapy

## 2021-04-18 DIAGNOSIS — M6281 Muscle weakness (generalized): Secondary | ICD-10-CM | POA: Diagnosis not present

## 2021-04-18 DIAGNOSIS — R278 Other lack of coordination: Secondary | ICD-10-CM

## 2021-04-18 NOTE — Therapy (Signed)
[place] at [facility] [address] [place], [place], [address] Phone: [phone]   Fax:  [phone]  Physical Therapy Treatment  Patient Details  Name: Nicole Cantu MRN: [id number] Date of Birth: [birth year] Referring Provider (PT): Dr. Sherlene Shams   Encounter Date: 04/18/2021   PT End of Session - 04/18/21 1403     Visit Number [place] - Visit Number 5    Authorization - Number of Visits 60    PT Start Time 1400    PT Stop Time 1455    PT Time Calculation (min) 55 min    Activity Tolerance Patient tolerated treatment well    Behavior During Therapy [facility] for tasks assessed/performed             Past Medical History:  Diagnosis Date   Anxiety    Arrhythmia    Holter 01/26/2008-HR [id number].[id number] rare PVC .pac No adverse arrhythmia   Arthritis    Back pain    Benign intracranial hypertension    Dr Jannifer [place]    Chest pain    NUC stress - no ischemia   Colon polyps    Fatigue    Hyperlipidemia    Hypertension    Pulsatile tinnitus of both ears     Past Surgical History:  Procedure Laterality Date   COLONOSCOPY     WISDOM TOOTH EXTRACTION     about 15 years ago    There were no vitals filed for this visit.   Subjective Assessment - 04/18/21 1304     Subjective I felt good after I saw you last week. I had muscle spams after putting the pessary in. The one exercies in standing increased my left upper back pain.    Patient Stated Goals reduce leakage, strengthen to assist with prolapse    Currently in Pain? No/denies    Multiple Pain Sites No                            Pelvic Floor Special Questions - 04/18/21 0001     Strength fair squeeze, definite lift               [facility] Adult PT Treatment/Exercise - 04/18/21 0001       Self-Care   Self-Care Other Self-Care Comments    Other Self-Care Comments  Using a tennis ball to  massage the upper back on the left.; education on tENS unit for overactive bladder and settings.      Lumbar Exercises: Sidelying   Hip Abduction Left;10 reps;1 second   pushing top hand into the block, contracting pelvic floor   Other Sidelying Lumbar Exercises pelvic floor contraction with tactile cues internall to form a circle      Lumbar Exercises: Quadruped   Other Quadruped Lumbar Exercises hip hinging with pelvic floor contraction and abdominal contraction and therapist finger in the vaginal canal to give feedback      Manual Therapy   Manual Therapy Myofascial release;Internal Pelvic Floor    Myofascial Release release of the sides of the fundus of the uterus in sidely wiht cmpression of the sacrum and hands anterior lower abdomen    Internal Pelvic Floor relesae aroudn the left side of the bladder, along the levator ani, left side of the ligaments that connect to the bladder in sidely  PT Education - 04/18/21 1403     Education Details Access Code: [email or web address]; how to use a tennis ball to work out the trigger points in her back    Person(s) Educated Patient    Methods Explanation;Demonstration;Verbal cues;Handout    Comprehension Verbalized understanding;Returned demonstration              PT Short Term Goals - 04/11/21 1137       PT SHORT TERM GOAL #1   Title independent with initial HEP for pelvic floor strengthening in supine    Time 4    Period Weeks    Status Achieved    Target Date 04/13/21      PT SHORT TERM GOAL #2   Title understands ways to manage prolapse during the day to reduce chance of prolapse from progressing    Time 4    Period Weeks    Status Achieved               PT Long Term Goals - 04/18/21 1406       PT LONG TERM GOAL #1   Title independent with advanced HEP for pelvic floor and core strength without aggravating her back or left shoulder    Time 12    Period Weeks    Status On-going    Target Date  06/08/21      PT LONG TERM GOAL #2   Title understand pressure management to reduce strain on the pelvic floor with lifting, daily tasks and gardening    Period Weeks    Status Achieved      PT LONG TERM GOAL #3   Title pelvic floor strength >/= 3/5 to reduce her urinary leakage >/= 75% during daily tasks    Time 12    Period Weeks    Status On-going      PT LONG TERM GOAL #4   Title wears 1 thin pad daily for just in case due to reduction of daily leakage.    Time 12    Period Weeks    Status On-going                   Plan - 04/18/21 1403     Clinical Impression Statement Pelvic floor strength is 3/5 with circular contraction and weak lift. She had less leakage after last session. When she puts the pessary in there may be more leaakge. She has increased in fascial restrictions on the left side of the bladder and pelvic floor. Her cervix is moving well. Patient is learning how to engage her abdominals and pelvic floor togerther to get the lift in the muscles. Patient will benefit from skilled therapy to improve pelvic floor coordination to reduce leakage.    Personal Factors and Comorbidities Fitness;Past/Current Experience    Examination-Activity Limitations Continence;Lift;Carry;Bend    Examination-Participation Restrictions Community Activity    Stability/Clinical Decision Making Stable/Uncomplicated    Rehab Potential Excellent    PT Frequency 1x / week    PT Duration 12 weeks    PT Treatment/Interventions ADLs/Self Care Home Management;Biofeedback;Therapeutic activities;Therapeutic exercise;Neuromuscular re-education;Patient/family education;Manual techniques    PT Next Visit Plan work onleft side of pelvic floor by bladder. work on Engineer, manufacturing systems with abdominal contraction in graveity assistive positions and standing    PT Home Exercise Plan Access Code: [email or web address]    Consulted and Agree with Plan of Care Patient             Patient will benefit from  skilled therapeutic  intervention in order to improve the following deficits and impairments:  Decreased endurance, Decreased activity tolerance, Decreased strength, Increased fascial restricitons, Decreased coordination  Visit Diagnosis: Muscle weakness (generalized)  Other lack of coordination     Problem List There are no problems to display for this patient.   Earlie Counts, PT 04/18/21 2:07 PM  [place] [facility] at [facility] [address], [place], [place], [address] Phone: [phone]   Fax:  [phone]  Name: Nicole Cantu MRN: [id number] Date of Birth: December 27, 1955

## 2021-04-18 NOTE — Patient Instructions (Signed)
Access Code: [email or web address] URL: [email or web address] Date: 04/18/2021 Prepared by: Earlie Counts  Program Notes lay on back with 2 pillows under hips, pillows under knees like a butterfly for 10 minutes.  massage the sides of the vaginal canal with 20 strokes on each side every other day.    Exercises Supine Pelvic Floor Contraction - 3 x daily - 7 x weekly - 1 sets - 10 reps - 5 sec hold Supine Hip Adduction Isometric with Ball - 1 x daily - 7 x weekly - 1 sets - 5 reps Bent Knee Fallouts - 1 x daily - 7 x weekly - 1 sets - 10 reps Standing Hip Hinge with Dowel - 1 x daily - 3 x weekly - 1 sets - 10 reps Hip Hinge Rock Back - 1 x daily - 3 x weekly - 1 sets - 10 reps Sidelying Hip Adduction - 1 x daily - 3 x weekly - 2 sets - 10 reps Earlie Counts, PT [facility] [address] [place], [place] [address] W: [phone] [email or web address]

## 2021-04-25 ENCOUNTER — Encounter: Payer: Self-pay | Admitting: Physical Therapy

## 2021-04-25 ENCOUNTER — Encounter: Payer: 59 | Admitting: Physical Therapy

## 2021-04-25 ENCOUNTER — Other Ambulatory Visit: Payer: Self-pay

## 2021-04-25 DIAGNOSIS — R278 Other lack of coordination: Secondary | ICD-10-CM

## 2021-04-25 DIAGNOSIS — M6281 Muscle weakness (generalized): Secondary | ICD-10-CM

## 2021-04-25 NOTE — Therapy (Signed)
[place] at [facility] [address] [place], [place], [address] Phone: [phone]   Fax:  [phone]  Physical Therapy Treatment  Patient Details  Name: Nicole Cantu MRN: [id number] Date of Birth: 1955-09-04 Referring Provider (PT): Dr. Sherlene Shams   Encounter Date: 04/25/2021   PT End of Session - 04/25/21 1558     Visit Number 6    Date for PT Re-Evaluation 06/08/21    Authorization Time [place]    Authorization - Visit Number 6    Authorization - Number of Visits 60    PT Start Time 1500    PT Stop Time 1545    PT Time Calculation (min) 45 min    Activity Tolerance Patient tolerated treatment well    Behavior During Therapy [facility] for tasks assessed/performed             Past Medical History:  Diagnosis Date   Anxiety    Arrhythmia    Holter 01/26/2008-HR [id number].[id number] rare PVC .pac No adverse arrhythmia   Arthritis    Back pain    Benign intracranial hypertension    Dr Jannifer [place]    Chest pain    NUC stress - no ischemia   Colon polyps    Fatigue    Hyperlipidemia    Hypertension    Pulsatile tinnitus of both ears     Past Surgical History:  Procedure Laterality Date   COLONOSCOPY     WISDOM TOOTH EXTRACTION     about 15 years ago    There were no vitals filed for this visit.   Subjective Assessment - 04/25/21 1507     Subjective I have not felt as many  leaks. No muscle spasms for 1 week. I was able to reset my prolpase without the pessary.    Patient Stated Goals reduce leakage, strengthen to assist with prolapse    Currently in Pain? No/denies    Multiple Pain Sites No                               [facility] Adult PT Treatment/Exercise - 04/25/21 0001       Self-Care   Self-Care Other Self-Care Comments    Other Self-Care Comments  explained the differenct between stress and overactive bladder. How to deter the urge to void. Education on how to fill out a bladder  diary.      Lumbar Exercises: Stretches   Hip Flexor Stretch Left;2 reps;30 seconds    Hip Flexor Stretch Limitations with a strap      Lumbar Exercises: Seated   Other Seated Lumbar Exercises seated hing forward with pelvic floor contraction 10x      Knee/Hip Exercises: Standing   Other Standing Knee Exercises standing hip flexion with pelvic floor contraction 10x each side; sit to stand with pelvic floor contraction                     PT Education - 04/25/21 1550     Education Details Access Code: [email or web address]; educated the difference of overactive bladder and stress incontinence; urge to void    Person(s) Educated Patient    Methods Explanation;Demonstration;Handout    Comprehension Returned demonstration;Verbalized understanding              PT Short Term Goals - 04/11/21 1137       PT SHORT TERM GOAL #1   Title independent with initial HEP  for pelvic floor strengthening in supine    Time 4    Period Weeks    Status Achieved    Target Date 04/13/21      PT SHORT TERM GOAL #2   Title understands ways to manage prolapse during the day to reduce chance of prolapse from progressing    Time 4    Period Weeks    Status Achieved               PT Long Term Goals - 04/25/21 1557       PT LONG TERM GOAL #1   Title independent with advanced HEP for pelvic floor and core strength without aggravating her back or left shoulder    Time 12    Period Weeks    Status On-going    Target Date 06/08/21      PT LONG TERM GOAL #2   Title understand pressure management to reduce strain on the pelvic floor with lifting, daily tasks and gardening    Time 12    Period Weeks    Status Achieved      PT LONG TERM GOAL #3   Title pelvic floor strength >/= 3/5 to reduce her urinary leakage >/= 75% during daily tasks    Time 12    Period Weeks    Status On-going      PT LONG TERM GOAL #4   Title wears 1 thin pad daily for just in case due to reduction of daily  leakage.    Time 12    Period Weeks    Status On-going                   Plan - 04/25/21 1552     Clinical Impression Statement Patient reports she is leaking less. She is able to feel the pelvic floor contract in sitting and standing. Patient will leak with sit to standing, lifting her leg to go up steps. Patient has not had bladder spasms since last visit. She was able to reset her prolapse yesterday and feel good afterwards. Patient has learned the urge to void to reduce the just in case toileting. Patient will benefit from skilled therapy to improve pelvic floor coordination to reduce leakage.    Personal Factors and Comorbidities Fitness;Past/Current Experience    Examination-Activity Limitations Continence;Lift;Carry;Bend    Examination-Participation Restrictions Community Activity    Stability/Clinical Decision Making Stable/Uncomplicated    Rehab Potential Excellent    PT Frequency 1x / week    PT Duration 12 weeks    PT Treatment/Interventions ADLs/Self Care Home Management;Biofeedback;Therapeutic activities;Therapeutic exercise;Neuromuscular re-education;Patient/family education;Manual techniques    PT Next Visit Plan see if leakge is better with the new exercises and urge to void. work on core strength in standing    PT Home Exercise Plan Access Code: [place] and Agree with Plan of Care Patient             Patient will benefit from skilled therapeutic intervention in order to improve the following deficits and impairments:  Decreased endurance, Decreased activity tolerance, Decreased strength, Increased fascial restricitons, Decreased coordination  Visit Diagnosis: Muscle weakness (generalized)  Other lack of coordination     Problem List There are no problems to display for this patient.   Earlie Counts, PT 04/25/21 3:59 PM  [place] [facility] at [facility] [address], [place], [place],  [address] Phone: [phone]   Fax:  [phone]  Name: Nicole Cantu  MRN: [id number] Date of Birth: 1956/02/16

## 2021-04-25 NOTE — Patient Instructions (Addendum)
Urge Incontinence  Ideal urination frequency is every 2-4 wakeful hours, which equates to 5-8 times within a 24-hour period.   Urge incontinence is leakage that occurs when the bladder muscle contracts, creating a sudden need to go before getting to the bathroom.   Going too often when your bladder isn't actually full can disrupt the body's automatic signals to store and hold urine longer, which will increase urgency/frequency.  In this case, the bladder is running the show and strategies can be learned to retrain this pattern.   One should be able to control the first urge to urinate, at around 169mL.  The bladder can hold up to a grande latte, or 440mL. To help you gain control, practice the Urge Drill below when urgency strikes.  This drill will help retrain your bladder signals and allow you to store and hold urine longer.  The overall goal is to stretch out your time between voids to reach a more manageable voiding schedule.    Practice your "quick flicks" often throughout the day (each waking hour) even when you don't need feel the urge to go.  This will help strengthen your pelvic floor muscles, making them more effective in controlling leakage.  Urge Drill  When you feel an urge to go, follow these steps to regain control: Stop what you are doing and be still Take one deep breath, directing your air into your abdomen Think an affirming thought, such as I've got this. Do 5 quick flicks of your pelvic floor Walk with control to the bathroom to void, or delay voiding  Access Code: [email or web address] URL: [email or web address] Date: 04/25/2021 Prepared by: Earlie Counts  Program Notes lay on back with 2 pillows under hips, pillows under knees like a butterfly for 10 minutes.  massage the sides of the vaginal canal with 20 strokes on each side every other day.    Exercises Supine Pelvic Floor Contraction - 3 x daily - 7 x weekly - 1 sets - 10 reps - 5 sec hold Supine Hip  Adduction Isometric with Ball - 1 x daily - 7 x weekly - 1 sets - 5 reps Bent Knee Fallouts - 1 x daily - 7 x weekly - 1 sets - 10 reps Standing Hip Hinge with Dowel - 1 x daily - 3 x weekly - 1 sets - 10 reps Hip Hinge Rock Back - 1 x daily - 3 x weekly - 1 sets - 10 reps Sidelying Hip Adduction - 1 x daily - 3 x weekly - 2 sets - 10 reps Supine Quadriceps Stretch with Strap on Table - 1 x daily - 2 x weekly - 1 sets - 2 reps - 30 sec hold Sit to Stand with Pelvic Floor Contraction - 1 x daily - 7 x weekly - 1 sets - 10 reps Standing Marching - 1 x daily - 3 x weekly - 2 sets - 10 reps Earlie Counts, PT [facility] [address] [place], [place] [address] W: [phone] [email or web address]

## 2021-05-02 ENCOUNTER — Other Ambulatory Visit: Payer: Self-pay

## 2021-05-02 ENCOUNTER — Encounter: Payer: 59 | Attending: Obstetrics and Gynecology | Admitting: Physical Therapy

## 2021-05-02 ENCOUNTER — Encounter: Payer: Self-pay | Admitting: Physical Therapy

## 2021-05-02 DIAGNOSIS — M6281 Muscle weakness (generalized): Secondary | ICD-10-CM | POA: Diagnosis not present

## 2021-05-02 DIAGNOSIS — R278 Other lack of coordination: Secondary | ICD-10-CM | POA: Diagnosis present

## 2021-05-02 NOTE — Therapy (Signed)
[place] ?Outpatient Rehabilitation at [facility] ?[address], [address] ?[place], [place], [address] ?Phone: [phone]   Fax:  [phone] ? ?Physical Therapy Treatment ? ?Patient Details  ?Name: Nicole Cantu ?MRN: [id number] ?Date of Birth: 07/20/1955 ?Referring Provider (PT): Dr. Sherlene Shams ? ? ?Encounter Date: 05/02/2021 ? ? PT End of Session - 05/02/21 1029   ? ? Visit Number 7   ? Date for PT Re-Evaluation 06/08/21   ? Authorization Time Period UHC   ? Authorization - Visit Number 7   ? Authorization - Number of Visits 60   ? PT Start Time 1028   ? PT Stop Time 1115   ? PT Time Calculation (min) 47 min   ? Activity Tolerance Patient tolerated treatment well   ? Behavior During Therapy [facility] for tasks assessed/performed   ? ?  ?  ? ?  ? ? ?Past Medical History:  ?Diagnosis Date  ? Anxiety   ? Arrhythmia   ? Holter 01/26/2008-HR [id number] rare PVC .pac No adverse arrhythmia  ? Arthritis   ? Back pain   ? Benign intracranial hypertension   ? Dr Jannifer Franklin   ? Chest pain   ? NUC stress - no ischemia  ? Colon polyps   ? Fatigue   ? Hyperlipidemia   ? Hypertension   ? Pulsatile tinnitus of both ears   ? ? ?Past Surgical History:  ?Procedure Laterality Date  ? COLONOSCOPY    ? WISDOM TOOTH EXTRACTION    ? about 15 years ago  ? ? ?There were no vitals filed for this visit. ? ? Subjective Assessment - 05/02/21 1029   ? ? Subjective I have kept the pessary out for 5 daysn and did not have issues. Only leaks with pessary.   ? Patient Stated Goals reduce leakage, strengthen to assist with prolapse   ? Currently in Pain? No/denies   ? Multiple Pain Sites No   ? ?  ?  ? ?  ? ? ? ? ? ? ? ? ? ? ? ? ? ? ? ? ? Pelvic Floor Special Questions - 05/02/21 0001   ? ? Pelvic Floor Internal Exam Patient confirms identificaton and approves PT to assess pelvic floor and treatment   ? Exam Type Vaginal   ? ?  ?  ? ?  ? ? ? ? [place] Adult PT Treatment/Exercise - 05/02/21 0001   ? ?  ? Self-Care  ? Self-Care Other  Self-Care Comments   ? Other Self-Care Comments  educated patient on different options for prolapse support and gave her the links   ?  ? Neuro Re-ed   ? Neuro Re-ed Details  assisitng the pelvic floor muscles with contraction in supine by bringing the muscles closer together   ?  ? Lumbar Exercises: Supine  ? Clam 10 reps;1 second   ? Clam Limitations with therapist giving tactile cues to the pelvic floor and abdominals   ? Bent Knee Raise 10 reps;1 second   ? Bent Knee Raise Limitations with therapist giving tactile cues to tthe pelvic floor   ?  ? Manual Therapy  ? Manual Therapy Myofascial release;Internal Pelvic Floor   ? Myofascial Release manual work around the introitus, alon gthe puborectalis, pubovaginalis, urethra sphincter   ? Internal Pelvic Floor release of the urogenital diaphragm and perineal body   ? ?  ?  ? ?  ? ? ? ? ? ? ? ? ? ? PT Education - 05/02/21 1121   ? ?  Education Details Access Code: [email or web address]  ; education on other supports for prolapse   ? Person(s) Educated Patient   ? Methods Explanation;Demonstration;Handout   ? Comprehension Verbalized understanding;Returned demonstration   ? ?  ?  ? ?  ? ? ? PT Short Term Goals - 04/11/21 1137   ? ?  ? PT SHORT TERM GOAL #1  ? Title independent with initial HEP for pelvic floor strengthening in supine   ? Time 4   ? Period Weeks   ? Status Achieved   ? Target Date 04/13/21   ?  ? PT SHORT TERM GOAL #2  ? Title understands ways to manage prolapse during the day to reduce chance of prolapse from progressing   ? Time 4   ? Period Weeks   ? Status Achieved   ? ?  ?  ? ?  ? ? ? ? PT Long Term Goals - 05/02/21 1049   ? ?  ? PT LONG TERM GOAL #1  ? Title independent with advanced HEP for pelvic floor and core strength without aggravating her back or left shoulder   ? Time 12   ? Period Weeks   ? Status On-going   ? Target Date 06/08/21   ?  ? PT LONG TERM GOAL #2  ? Title understand pressure management to reduce strain on the pelvic floor with lifting,  daily tasks and gardening   ? Time 12   ? Period Weeks   ? Status Achieved   ? Target Date 06/08/21   ?  ? PT LONG TERM GOAL #3  ? Title pelvic floor strength >/= 3/5 to reduce her urinary leakage >/= 75% during daily tasks   ? Time 12   ? Period Weeks   ? Status On-going   ? Target Date 06/08/21   ?  ? PT LONG TERM GOAL #4  ? Title wears 1 thin pad daily for just in case due to reduction of daily leakage.   ? Time 12   ? Period Weeks   ? Status On-going   ? Target Date 06/08/21   ? ?  ?  ? ?  ? ? ? ? ? ? ? ? Plan - 05/02/21 1048   ? ? Clinical Impression Statement Patient needed tactile cues to the pelvic floor and abdominals to get a better contraction and hug of the therapist finger. She was able to do leg movements with pelvic floor contraction but must go slowly and focus on the abdominal and pelvic floor contraction. She will leak urine when the pessary is in and therapist educated on other options. Patient was able to go 5 days wihtout a pessary and no leakage or feeling of the bladder dropping using her techniques she has learned. Patient will benefit from skilled therapy to improve pelvic floor coordinaiton and strength to reduce leakage.   ? Personal Factors and Comorbidities Fitness;Past/Current Experience   ? Examination-Activity Limitations Continence;Lift;Carry;Bend   ? Examination-Participation Restrictions Community Activity   ? Stability/Clinical Decision Making Stable/Uncomplicated   ? Rehab Potential Excellent   ? PT Frequency 1x / week   ? PT Duration 12 weeks   ? PT Treatment/Interventions ADLs/Self Care Home Management;Biofeedback;Therapeutic activities;Therapeutic exercise;Neuromuscular re-education;Patient/family education;Manual techniques   ? PT Next Visit Plan work on pelvi clfoor, supine abdominal exercises   ? PT Home Exercise Plan Access Code: [email or web address]   ? Consulted and Agree with Plan of Care Patient   ? ?  ?  ? ?  ? ? ?  Patient will benefit from skilled therapeutic intervention in  order to improve the following deficits and impairments:  Decreased endurance, Decreased activity tolerance, Decreased strength, Increased fascial restricitons, Decreased coordination ? ?Visit Diagnosis: ?Muscle weakness (generalized) ? ?Other lack of coordination ? ? ? ? ?Problem List ?There are no problems to display for this patient. ? ? ?Earlie Counts, PT ?05/02/21 11:32 AM ? ?[place] ?Outpatient Rehabilitation at [facility] ?[address], [address] ?[place], [place], [address] ?Phone: [phone]   Fax:  [phone] ? ?Name: Nicole Cantu ?MRN: [id number] ?Date of Birth: 1955/11/28 ? ? ? ?

## 2021-05-02 NOTE — Patient Instructions (Addendum)
Things to support a prolapse ? ?Sea Pearls Reusable Sea Sponges - Classic Medium 2pk ? ?JOMECA V-Sling Pelvic Support Belt for Prolapse, Pregnancy SPD, Vulvar Varicosities, LCS, Pelvic Floor, Organ Prolapse Support Relieve Tilted or Twisted Pelvis Girdle Pain (Large) ? ?[email or web address] ? ?[email or web address] ? ?[email or web address] ? ? ? ?Access Code: [email or web address] ?URL: [email or web address] ?Date: 05/02/2021 ?Prepared by: Earlie Counts ? ?Program Notes ?lay on back with 2 pillows under hips, pillows under knees like a butterfly for 10 minutes.  ?massage the sides of the vaginal canal with 20 strokes on each side every other day.  ? ? ?Exercises ?Sit to Stand with Pelvic Floor Contraction - 1 x daily - 7 x weekly - 1 sets - 10 reps ?Standing Marching - 1 x daily - 3 x weekly - 2 sets - 10 reps ?Supine March with Elevation - 1 x daily - 7 x weekly - 1 sets - 10 reps ?Bent Knee Fallouts - 1 x daily - 7 x weekly - 1 sets - 10 reps ? ?Earlie Counts, PT ?[facility] ?[address], [address] ?[place], [place] [address] ?W: [phone] ?[email or web address] ? ? ?

## 2021-05-09 ENCOUNTER — Encounter: Payer: Self-pay | Admitting: Physical Therapy

## 2021-05-09 ENCOUNTER — Other Ambulatory Visit: Payer: Self-pay

## 2021-05-09 ENCOUNTER — Encounter: Payer: 59 | Admitting: Physical Therapy

## 2021-05-09 DIAGNOSIS — M6281 Muscle weakness (generalized): Secondary | ICD-10-CM | POA: Diagnosis not present

## 2021-05-09 DIAGNOSIS — R278 Other lack of coordination: Secondary | ICD-10-CM

## 2021-05-09 NOTE — Therapy (Signed)
[place] ?Outpatient Rehabilitation at [facility] ?[address], [address] ?[place], [place], [address] ?Phone: [phone]   Fax:  [phone] ? ?Physical Therapy Treatment ? ?Patient Details  ?Name: Nicole Cantu ?MRN: [id number] ?Date of Birth: Feb 15, 1956 ?Referring Provider (PT): Dr. Sherlene Shams ? ? ?Encounter Date: 05/09/2021 ? ? PT End of Session - 05/09/21 1034   ? ? Visit Number 8   ? Date for PT Re-Evaluation 06/08/21   ? Authorization Time Period UHC   ? Authorization - Visit Number 8   ? Authorization - Number of Visits 60   ? PT Start Time 1030   ? PT Stop Time 1115   ? PT Time Calculation (min) 45 min   ? Activity Tolerance Patient tolerated treatment well   ? Behavior During Therapy [facility] for tasks assessed/performed   ? ?  ?  ? ?  ? ? ?Past Medical History:  ?Diagnosis Date  ? Anxiety   ? Arrhythmia   ? Holter 01/26/2008-HR [id number] rare PVC .pac No adverse arrhythmia  ? Arthritis   ? Back pain   ? Benign intracranial hypertension   ? Dr Jannifer Franklin   ? Chest pain   ? NUC stress - no ischemia  ? Colon polyps   ? Fatigue   ? Hyperlipidemia   ? Hypertension   ? Pulsatile tinnitus of both ears   ? ? ?Past Surgical History:  ?Procedure Laterality Date  ? COLONOSCOPY    ? WISDOM TOOTH EXTRACTION    ? about 15 years ago  ? ? ?There were no vitals filed for this visit. ? ? Subjective Assessment - 05/09/21 1035   ? ? Subjective I am doing the TENS unit on my ankle. My back is not feeling good so I am having trouble.   ? Patient Stated Goals reduce leakage, strengthen to assist with prolapse   ? ?  ?  ? ?  ? ? ? ? ? [facility] PT Assessment - 05/09/21 0001   ? ?  ? PROM  ? Right Hip External Rotation  65   ? Right Hip Internal Rotation  20   ? Left Hip External Rotation  55   after treatment 75  ? Left Hip Internal Rotation  10   after treatment 20  ? ?  ?  ? ?  ? ? ? ? ? ? ? ? ? ? ? ? ? ? ? ? [place] Adult PT Treatment/Exercise - 05/09/21 0001   ? ?  ? Lumbar Exercises: Stretches  ? Other Lumbar  Stretch Exercise hip flexion, adduction, internal rotation and external rotation with therapist helping at end range   ?  ? Knee/Hip Exercises: Standing  ? Hip Abduction Stengthening;Left;2 sets;10 reps;Knee straight   ? Abduction Limitations sliding the left leg outward with core engagement and pelvic floor contraction   ?  ? Manual Therapy  ? Manual Therapy Soft tissue mobilization;Joint mobilization   ? Manual therapy comments to assess for dry needling   ? Joint Mobilization distraction to left hip, inferior glide and lateral glide grade 3 to left hip   ? Soft tissue mobilization using the addaday to the left hip adductor, quadricep, hamstring, gluteus medius and quadratus   ? ?  ?  ? ?  ? ? ? Trigger Point Dry Needling - 05/09/21 0001   ? ? Consent Given? Yes   ? Education Handout Provided Yes   ? Muscles Treated Lower Quadrant Adductor longus/brevis/magnus;Vastus lateralis   ? Adductor Response  Twitch response elicited;Palpable increased muscle length   ? Vastus lateralis Response Twitch response elicited;Palpable increased muscle length   ? ?  ?  ? ?  ? ? ? ? ? ? ? ? PT Education - 05/09/21 1125   ? ? Education Details Access Code: [email or web address]; information on dry needling   ? Person(s) Educated Patient   ? Methods Explanation;Demonstration;Handout   ? Comprehension Returned demonstration;Verbalized understanding   ? ?  ?  ? ?  ? ? ? PT Short Term Goals - 04/11/21 1137   ? ?  ? PT SHORT TERM GOAL #1  ? Title independent with initial HEP for pelvic floor strengthening in supine   ? Time 4   ? Period Weeks   ? Status Achieved   ? Target Date 04/13/21   ?  ? PT SHORT TERM GOAL #2  ? Title understands ways to manage prolapse during the day to reduce chance of prolapse from progressing   ? Time 4   ? Period Weeks   ? Status Achieved   ? ?  ?  ? ?  ? ? ? ? PT Long Term Goals - 05/02/21 1049   ? ?  ? PT LONG TERM GOAL #1  ? Title independent with advanced HEP for pelvic floor and core strength without aggravating her  back or left shoulder   ? Time 12   ? Period Weeks   ? Status On-going   ? Target Date 06/08/21   ?  ? PT LONG TERM GOAL #2  ? Title understand pressure management to reduce strain on the pelvic floor with lifting, daily tasks and gardening   ? Time 12   ? Period Weeks   ? Status Achieved   ? Target Date 06/08/21   ?  ? PT LONG TERM GOAL #3  ? Title pelvic floor strength >/= 3/5 to reduce her urinary leakage >/= 75% during daily tasks   ? Time 12   ? Period Weeks   ? Status On-going   ? Target Date 06/08/21   ?  ? PT LONG TERM GOAL #4  ? Title wears 1 thin pad daily for just in case due to reduction of daily leakage.   ? Time 12   ? Period Weeks   ? Status On-going   ? Target Date 06/08/21   ? ?  ?  ? ?  ? ? ? ? ? ? ? ? Plan - 05/09/21 1046   ? ? Clinical Impression Statement Patient will leak urine with increased in back pain and when she puts her pessary in. She has trouble feeling her pelvic floro contract when she has increased in back pain. Patient had trigger points in her left hip adductor and quadricep. Patient responds well to dry needling and lengthening of her left leg muscles to help the pelvic floor contract better. She had increased in left hip rotation after manual work. Patient needed tactile cues to engage her core andpelvic floor when doing hip abduction in standing. Patient will benefit from skilled therapy to improve pelvic floor coordination and strength to reduce leakage.   ? Personal Factors and Comorbidities Fitness;Past/Current Experience   ? Examination-Activity Limitations Continence;Lift;Carry;Bend   ? Examination-Participation Restrictions Community Activity   ? Stability/Clinical Decision Making Stable/Uncomplicated   ? Rehab Potential Excellent   ? PT Frequency 1x / week   ? PT Duration 12 weeks   ? PT Treatment/Interventions ADLs/Self Care Home Management;Biofeedback;Therapeutic activities;Therapeutic exercise;Neuromuscular re-education;Patient/family education;Manual techniques;Dry  needling   ? PT Next Visit Plan dry needle the left hip adductor, work on strength in standing for left hip and core; add dry needling to her care plan   ? PT Home Exercise Plan Access Code: [email or web address]   ? Recommended Other Services sent MD note to include dry needling   ? Consulted and Agree with Plan of Care Patient   ? ?  ?  ? ?  ? ? ?Patient will benefit from skilled therapeutic intervention in order to improve the following deficits and impairments:  Decreased endurance, Decreased activity tolerance, Decreased strength, Increased fascial restricitons, Decreased coordination ? ?Visit Diagnosis: ?Muscle weakness (generalized) ? ?Other lack of coordination ? ? ? ? ?Problem List ?There are no problems to display for this patient. ? ? ?Earlie Counts, PT ?05/09/21 11:31 AM ? ? ?[place] ?Outpatient Rehabilitation at [facility] ?[address], [address] ?[place], [place], [address] ?Phone: [phone]   Fax:  [phone] ? ?Name: Antania Hoefling ?MRN: [id number] ?Date of Birth: [birth year] ? ? ? ?

## 2021-05-09 NOTE — Patient Instructions (Addendum)
Trigger Point Dry Needling ? ?What is Trigger Point Dry Needling (DN)? ?DN is a physical therapy technique used to treat muscle pain and dysfunction. Specifically, DN helps deactivate muscle trigger points (muscle knots).  ?A thin filiform needle is used to penetrate the skin and stimulate the underlying trigger point. The goal is for a local twitch response (LTR) to occur and for the trigger point to relax. No medication of any kind is injected during the procedure.  ? ?What Does Trigger Point Dry Needling Feel Like?  ?The procedure feels different for each individual patient. Some patients report that they do not actually feel the needle enter the skin and overall the process is not painful. Very mild bleeding may occur. However, many patients feel a deep cramping in the muscle in which the needle was inserted. This is the local twitch response.  ? ?How Will I feel after the treatment? ?Soreness is normal, and the onset of soreness may not occur for a few hours. Typically this soreness does not last longer than two days.  ?Bruising is uncommon, however; ice can be used to decrease any possible bruising.  ?In rare cases feeling tired or nauseous after the treatment is normal. In addition, your symptoms may get worse before they get better, this period will typically not last longer than 24 hours.  ? ?What Can I do After My Treatment? ?Increase your hydration by drinking more water for the next 24 hours. ?You may place ice or heat on the areas treated that have become sore, however, do not use heat on inflamed or bruised areas. Heat often brings more relief post needling. ?You can continue your regular activities, but vigorous activity is not recommended initially after the treatment for 24 hours. ?Access Code: [email or web address] ?URL: [email or web address] ?Date: 05/09/2021 ?Prepared by: Earlie Counts ? ? ? ?Exercises ?Standing Hip Abduction - 1 x daily - 3 x weekly - 2 sets - 10 reps ?DN is best combined with  other physical therapy such as strengthening, stretching, and other therapies. ?Earlie Counts, PT ?[facility] ?[address], [address] ?[place], [place] [address] ?W: [phone] ?[email or web address] ? ? ?

## 2021-05-16 ENCOUNTER — Encounter: Payer: Self-pay | Admitting: Physical Therapy

## 2021-05-16 ENCOUNTER — Encounter: Payer: 59 | Admitting: Physical Therapy

## 2021-05-16 ENCOUNTER — Other Ambulatory Visit: Payer: Self-pay

## 2021-05-16 DIAGNOSIS — M6281 Muscle weakness (generalized): Secondary | ICD-10-CM | POA: Diagnosis not present

## 2021-05-16 DIAGNOSIS — R278 Other lack of coordination: Secondary | ICD-10-CM

## 2021-05-16 NOTE — Therapy (Signed)
[facility] ?Outpatient Rehabilitation at [facility] ?[address], [address] ?[place], [place], [address] ?Phone: [phone]   Fax:  [phone] ? ?Physical Therapy Treatment ? ?Patient Details  ?Name: Nicole Cantu ?MRN: [id number] ?Date of Birth: 04-Jun-1955 ?Referring Provider (PT): Dr. Sherlene Shams ? ? ?Encounter Date: 05/16/2021 ? ? PT End of Session - 05/16/21 1038   ? ? Visit Number 9   ? Date for PT Re-Evaluation 06/08/21   ? Authorization Time Period UHC   ? Authorization - Visit Number 9   ? Authorization - Number of Visits 60   ? PT Start Time 1035   ? PT Stop Time 1120   ? PT Time Calculation (min) 45 min   ? Activity Tolerance Patient tolerated treatment well   ? Behavior During Therapy [facility] for tasks assessed/performed   ? ?  ?  ? ?  ? ? ?Past Medical History:  ?Diagnosis Date  ? Anxiety   ? Arrhythmia   ? Holter 01/26/2008-HR [id number] rare PVC .pac No adverse arrhythmia  ? Arthritis   ? Back pain   ? Benign intracranial hypertension   ? Dr Jannifer Franklin   ? Chest pain   ? NUC stress - no ischemia  ? Colon polyps   ? Fatigue   ? Hyperlipidemia   ? Hypertension   ? Pulsatile tinnitus of both ears   ? ? ?Past Surgical History:  ?Procedure Laterality Date  ? COLONOSCOPY    ? WISDOM TOOTH EXTRACTION    ? about 15 years ago  ? ? ?There were no vitals filed for this visit. ? ? Subjective Assessment - 05/16/21 1038   ? ? Subjective I did not use the pessary for 5 days. Place it in when doing alot of work. I do not have the urgency. I do not having stress incontinence unless my back is hurting. I am going to the bathroom 8 times per day. The bladder diary has helped. I am able to get my left leg down since the dry needling.   ? Patient Stated Goals reduce leakage, strengthen to assist with prolapse   ? Currently in Pain? Yes   ? Pain Score 4    was a 10/10 but did my mnagement of it  ? Pain Location Back   ? Pain Orientation Lower   ? Pain Descriptors / Indicators Aching;Spasm   ? Pain Type  Chronic pain   ? Pain Onset More than a month ago   ? Pain Frequency Intermittent   ? Aggravating Factors  randomly, movement   ? Pain Relieving Factors TENS unit   ? Multiple Pain Sites No   ? ?  ?  ? ?  ? ? ? ? ? ? ? ? ? ? ? ? ? ? ? ? ? ? ? ? [facility] PT Treatment/Exercise - 05/16/21 0001   ? ?  ? Therapeutic Activites   ? Therapeutic Activities Other Therapeutic Activities   ? Other Therapeutic Activities educated patient on sleeping posture to reduce strain on prolapse and reduce back pain to help with pelvic floor   ?  ? Lumbar Exercises: Supine  ? Ab Set 10 reps;5 seconds   ? AB Set Limitations with pelvic floor contraction   ? Clam 10 reps;1 second   right, left  ? Clam Limitations with therapist giving tactile cues to the  abdominals   ? Bent Knee Raise 10 reps;1 second   Right, left  ? Bent Knee Raise Limitations with therapist  giving tactile cues to tthe lower abdominals   ?  ? Manual Therapy  ? Manual Therapy Soft tissue mobilization;Joint mobilization;Myofascial release   ? Manual therapy comments to assess for dry needling   ? Joint Mobilization side glide, PA and rotational mobilization to lumbar to imporve mobility for patine tto better contract the pelvic floor   ? Soft tissue mobilization manual work to bilateral quadratus, lumbar paraspinals and glutesus medius to elongate after dry needling   ? Myofascial Release Using suction cup to lumbar to release fascia and improve blood flow   ? ?  ?  ? ?  ? ? ? Trigger Point Dry Needling - 05/16/21 0001   ? ? Consent Given? Yes   ? Education Handout Provided Previously provided   ? Muscles Treated Back/Hip Gluteus medius;Quadratus lumborum;Lumbar multifidi   ? Gluteus Medius Response Twitch response elicited;Palpable increased muscle length   ? Lumbar multifidi Response Palpable increased muscle length;Twitch response elicited   ? Quadratus Lumborum Response Palpable increased muscle length;Twitch response elicited   ? ?  ?  ? ?  ? ? ? ? ? ? ? ? ? ? PT  Short Term Goals - 04/11/21 1137   ? ?  ? PT SHORT TERM GOAL #1  ? Title independent with initial HEP for pelvic floor strengthening in supine   ? Time 4   ? Period Weeks   ? Status Achieved   ? Target Date 04/13/21   ?  ? PT SHORT TERM GOAL #2  ? Title understands ways to manage prolapse during the day to reduce chance of prolapse from progressing   ? Time 4   ? Period Weeks   ? Status Achieved   ? ?  ?  ? ?  ? ? ? ? PT Long Term Goals - 05/16/21 1046   ? ?  ? PT LONG TERM GOAL #1  ? Title independent with advanced HEP for pelvic floor and core strength without aggravating her back or left shoulder   ? Time 12   ? Period Weeks   ? Status On-going   ? Target Date 06/08/21   ?  ? PT LONG TERM GOAL #2  ? Title understand pressure management to reduce strain on the pelvic floor with lifting, daily tasks and gardening   ? Time 12   ? Period Weeks   ? Status Achieved   ? Target Date 06/08/21   ?  ? PT LONG TERM GOAL #3  ? Title pelvic floor strength >/= 3/5 to reduce her urinary leakage >/= 75% during daily tasks   ? Time 12   ? Period Weeks   ? Status On-going   ? Target Date 06/08/21   ?  ? PT LONG TERM GOAL #4  ? Title wears 1 thin pad daily for just in case due to reduction of daily leakage.   ? Time 12   ? Period Weeks   ? Status On-going   ? ?  ?  ? ?  ? ? ? ? ? ? ? ? Plan - 05/16/21 1129   ? ? Clinical Impression Statement Patient has been without the pessary for 5 days du eto things feeling stronger. She will wear the pessary when she is doing heavy work. Paitnet will leak urine when she has increased back pain due to tilting the pelvis posteirorly and tightening the muscles. Therapist worked on patient back and core to reduce theleakage and assist with correct pelvic alginment so the  muscle can work correctly. She had no back pain after manual work and was able to feel the pelvic floor contract better. She had more difficulty with contracting the left lower abdominals compared to the right. Patient is able to  fully bring her left knee outward since the hip adductors were dry needled. Patient will benefit from skilled therapy to improve pelvic floor coordination and strength to reduce leakage.   ? Personal Factors and Comorbidities Fitness;Past/Current Experience   ? Examination-Activity Limitations Continence;Lift;Carry;Bend   ? Examination-Participation Restrictions Community Activity   ? Stability/Clinical Decision Making Stable/Uncomplicated   ? Rehab Potential Excellent   ? PT Frequency 1x / week   ? PT Duration 12 weeks   ? PT Treatment/Interventions ADLs/Self Care Home Management;Biofeedback;Therapeutic activities;Therapeutic exercise;Neuromuscular re-education;Patient/family education;Manual techniques;Dry needling   ? PT Next Visit Plan contnue with dry needling ot the lumbar, core strength with pelvic floor exercises; manual work to back   ? PT Home Exercise Plan Access Code: [email or web address]   ? Recommended Other Services MD okayed dry needling   ? Consulted and Agree with Plan of Care Patient   ? ?  ?  ? ?  ? ? ?Patient will benefit from skilled therapeutic intervention in order to improve the following deficits and impairments:  Decreased endurance, Decreased activity tolerance, Decreased strength, Increased fascial restricitons, Decreased coordination ? ?Visit Diagnosis: ?Muscle weakness (generalized) ? ?Other lack of coordination ? ? ? ? ?Problem List ?There are no problems to display for this patient. ? ? ?Earlie Counts, PT ?05/16/21 11:33 AM ? ?[place] ?Outpatient Rehabilitation at [facility] ?[address], [address] ?[place], [place], [address] ?Phone: [phone]   Fax:  [phone] ? ?Name: Nicole Cantu ?MRN: [id number] ?Date of Birth: 25-May-1955 ? ? ? ?

## 2021-05-23 ENCOUNTER — Encounter: Payer: Self-pay | Admitting: Physical Therapy

## 2021-05-23 ENCOUNTER — Encounter: Payer: 59 | Admitting: Physical Therapy

## 2021-05-23 ENCOUNTER — Other Ambulatory Visit: Payer: Self-pay

## 2021-05-23 DIAGNOSIS — R278 Other lack of coordination: Secondary | ICD-10-CM

## 2021-05-23 DIAGNOSIS — M6281 Muscle weakness (generalized): Secondary | ICD-10-CM

## 2021-05-23 NOTE — Patient Instructions (Signed)
Access Code: [email or web address] ?URL: [email or web address] ?Date: 05/23/2021 ?Prepared by: Earlie Counts ? ?Program Notes ?lay on back with 2 pillows under hips, pillows under knees like a butterfly for 10 minutes. massage the sides of the vaginal canal with 20 strokes on each side every other day.  ? ?Exercises ?-- Quadruped Yoga Block Lift Off  - 1 x daily - 3 x weekly - 1 sets - 10 reps ?Earlie Counts, PT ?[facility] ?[address], [address] ?[place], [place] [address] ?W: [phone] ?[email or web address] ? ?

## 2021-05-23 NOTE — Therapy (Signed)
[place] ?Outpatient Rehabilitation at [facility] ?[address], [address] ?[place], [place], [address] ?Phone: [phone]   Fax:  [phone] ? ?Physical Therapy Treatment ? ?Patient Details  ?Name: Nicole Cantu ?MRN: [id number] ?Date of Birth: Dec 03, 1955 ?Referring Provider (PT): Dr. Sherlene Shams ? ? ?Encounter Date: 05/23/2021 ? ? PT End of Session - 05/23/21 1038   ? ? Visit Number 10   ? Date for PT Re-Evaluation 06/08/21   ? Authorization Time Period UHC   ? Authorization - Visit Number 10   ? Authorization - Number of Visits 60   ? PT Start Time 1030   ? PT Stop Time 1122   ? PT Time Calculation (min) 52 min   ? Activity Tolerance Patient tolerated treatment well   ? Behavior During Therapy [facility] for tasks assessed/performed   ? ?  ?  ? ?  ? ? ?Past Medical History:  ?Diagnosis Date  ? Anxiety   ? Arrhythmia   ? Holter 01/26/2008-HR [id number] rare PVC .pac No adverse arrhythmia  ? Arthritis   ? Back pain   ? Benign intracranial hypertension   ? Dr Jannifer Franklin   ? Chest pain   ? NUC stress - no ischemia  ? Colon polyps   ? Fatigue   ? Hyperlipidemia   ? Hypertension   ? Pulsatile tinnitus of both ears   ? ? ?Past Surgical History:  ?Procedure Laterality Date  ? COLONOSCOPY    ? WISDOM TOOTH EXTRACTION    ? about 15 years ago  ? ? ?There were no vitals filed for this visit. ? ? Subjective Assessment - 05/23/21 1038   ? ? Subjective I used the pessary more than I wanted to. I am learning to manage the prolapse. After last session I mowed for 3 hours and did not have pain. I used my TENS unit 1 hour per day. My husband feels I am walking better. I am having less leakage with less back pain.   ? Patient Stated Goals reduce leakage, strengthen to assist with prolapse   ? Currently in Pain? Yes   ? Pain Score 3    ? Pain Location Back   ? Pain Orientation Lower   ? Pain Descriptors / Indicators Aching;Spasm   ? Pain Type Chronic pain   ? Pain Onset More than a month ago   ? Pain Frequency  Intermittent   ? Aggravating Factors  random movement   ? Pain Relieving Factors TENS unit   ? Multiple Pain Sites No   ? ?  ?  ? ?  ? ? ? ? ? ? ? ? ? ? ? ? ? ? ? ? ? ? ? ? [facility] Adult PT Treatment/Exercise - 05/23/21 0001   ? ?  ? Self-Care  ? Self-Care Other Self-Care Comments   ? Other Self-Care Comments  educated patient to be on her riding mower for 30 minutes then get off and  move around to decrease strain on the back and pelvic floor   ?  ? Therapeutic Activites   ? Therapeutic Activities Other Therapeutic Activities   ? Other Therapeutic Activities standing wiht equal weight on feet, finding spinal neutral to reduce strain on the pelvic floor and she was able to contract the lower abdominals and pelvic floor better   ?  ? Lumbar Exercises: Stretches  ? Piriformis Stretch Right;Left;1 rep;30 seconds   ? Piriformis Stretch Limitations sitting with tactile cues to not sidebend trunk   ?  Other Lumbar Stretch Exercise standing hip adductor stretch holding ofr 30 sec, bil.   ?  ? Lumbar Exercises: Quadruped  ? Other Quadruped Lumbar Exercises quadruped leg on block moving the weight onto the block to stretch the posterio hip capsule and SI joint while perfroming wiht the addaday to massage the buttocks; quadruped wiht knee on yoga block and transfer weight onto the block then lift the hip on the mat level with the other hip to work the multifidi and gluteus medius with engagement of the lower abdominals and pelvic floor   ?  ? Manual Therapy  ? Manual Therapy Myofascial release;Soft tissue mobilization;Joint mobilization   ? Manual therapy comments to assess for dry needling   ? Soft tissue mobilization manual work to the lumbar paraspinals and quadratus to elongate after dry needling; manual work to bilateral coccygeus in prone wiht moving hips into hip internal rotation toelongate the muscles   ? Myofascial Release using the iastim tool alon g the lumbar, sides of trunk, and gluteus to elongate and reduce  fascial restrictions   ? ?  ?  ? ?  ? ? ? Trigger Point Dry Needling - 05/23/21 0001   ? ? Consent Given? Yes   ? Education Handout Provided Previously provided   ? Muscles Treated Back/Hip Lumbar multifidi;Quadratus lumborum   ? Lumbar multifidi Response Twitch response elicited;Palpable increased muscle length   ? Quadratus Lumborum Response Twitch response elicited;Palpable increased muscle length   right  ? ?  ?  ? ?  ? ? ? ? ? ? ? ? PT Education - 05/23/21 1131   ? ? Education Details Access Code: [email or web address]; education on correct posture in standing to place equal weight on both legs and engage the lower abdominals   ? Person(s) Educated Patient   ? Methods Explanation;Demonstration;Handout   ? Comprehension Returned demonstration;Verbalized understanding   ? ?  ?  ? ?  ? ? ? PT Short Term Goals - 04/11/21 1137   ? ?  ? PT SHORT TERM GOAL #1  ? Title independent with initial HEP for pelvic floor strengthening in supine   ? Time 4   ? Period Weeks   ? Status Achieved   ? Target Date 04/13/21   ?  ? PT SHORT TERM GOAL #2  ? Title understands ways to manage prolapse during the day to reduce chance of prolapse from progressing   ? Time 4   ? Period Weeks   ? Status Achieved   ? ?  ?  ? ?  ? ? ? ? PT Long Term Goals - 05/16/21 1046   ? ?  ? PT LONG TERM GOAL #1  ? Title independent with advanced HEP for pelvic floor and core strength without aggravating her back or left shoulder   ? Time 12   ? Period Weeks   ? Status On-going   ? Target Date 06/08/21   ?  ? PT LONG TERM GOAL #2  ? Title understand pressure management to reduce strain on the pelvic floor with lifting, daily tasks and gardening   ? Time 12   ? Period Weeks   ? Status Achieved   ? Target Date 06/08/21   ?  ? PT LONG TERM GOAL #3  ? Title pelvic floor strength >/= 3/5 to reduce her urinary leakage >/= 75% during daily tasks   ? Time 12   ? Period Weeks   ? Status On-going   ? Target Date  06/08/21   ?  ? PT LONG TERM GOAL #4  ? Title wears 1 thin pad  daily for just in case due to reduction of daily leakage.   ? Time 12   ? Period Weeks   ? Status On-going   ? ?  ?  ? ?  ? ? ? ? ? ? ? ? Plan - 05/23/21 1138   ? ? Clinical Impression Statement Patient was able to [facility] her lawn on a riding mower for 3 hours without pain. Her back pain has declined so her urinary leakage declined. Patient was able to engage her lower abdominals with greater ease and feel a better pelvic floor contraction. She has tightness in the coccygeus and poster hip. Patient had increased trigger point release in the lumbar and able to move better after the manual work. Patient will benefit from skilled therapy to improve pelvic floor coordination and strength to reduce leakage.   ? Personal Factors and Comorbidities Fitness;Past/Current Experience   ? Examination-Activity Limitations Continence;Lift;Carry;Bend   ? Examination-Participation Restrictions Community Activity   ? Stability/Clinical Decision Making Stable/Uncomplicated   ? Rehab Potential Excellent   ? PT Frequency 1x / week   ? PT Duration 12 weeks   ? PT Treatment/Interventions ADLs/Self Care Home Management;Biofeedback;Therapeutic activities;Therapeutic exercise;Neuromuscular re-education;Patient/family education;Manual techniques;Dry needling   ? PT Next Visit Plan continue with dry needling if needed; write renewal, HEP for prolapse in supine   ? PT Home Exercise Plan Access Code: [email or web address]   ? Consulted and Agree with Plan of Care Patient   ? ?  ?  ? ?  ? ? ?Patient will benefit from skilled therapeutic intervention in order to improve the following deficits and impairments:  Decreased endurance, Decreased activity tolerance, Decreased strength, Increased fascial restricitons, Decreased coordination ? ?Visit Diagnosis: ?Muscle weakness (generalized) ? ?Other lack of coordination ? ? ? ? ?Problem List ?There are no problems to display for this patient. ? ? ?Earlie Counts, PT ?05/23/21 11:43 AM ? ?[place] ?Outpatient  Rehabilitation at [facility] ?[address], [address] ?[place], [place], [address] ?Phone: [phone]   Fax:  [phone] ? ?Name: Nicole Cantu ?MRN: [id number] ?Date of Birth: 1955/11/08 ? ? ? ?

## 2021-05-26 ENCOUNTER — Encounter: Payer: Self-pay | Admitting: Obstetrics and Gynecology

## 2021-06-08 ENCOUNTER — Encounter: Payer: 59 | Attending: Obstetrics and Gynecology | Admitting: Physical Therapy

## 2021-06-08 ENCOUNTER — Encounter: Payer: Self-pay | Admitting: Physical Therapy

## 2021-06-08 DIAGNOSIS — N393 Stress incontinence (female) (male): Secondary | ICD-10-CM | POA: Insufficient documentation

## 2021-06-08 DIAGNOSIS — M6281 Muscle weakness (generalized): Secondary | ICD-10-CM | POA: Diagnosis not present

## 2021-06-08 DIAGNOSIS — R278 Other lack of coordination: Secondary | ICD-10-CM | POA: Diagnosis not present

## 2021-06-08 NOTE — Patient Instructions (Signed)
Access Code: [email or web address] ?URL: [email or web address] ?Date: 06/08/2021 ?Prepared by: Earlie Counts ? ?Program Notes ?lay on back with 2 pillows under hips, pillows under knees like a butterfly for 10 minutes. massage the sides of the vaginal canal with 20 strokes on each side every other day.  ? ?Exercises ?- - Hamstring Set with Swiss Ball  - 1 x daily - 1 x weekly - 1 sets - 10 reps - 5ec hold ?- Quadruped Leg Lifts  - 1 x daily - 3 x weekly - 1 sets - 10 reps ?Earlie Counts, PT ?[facility] ?[address], [address] ?[place], [place] [address] ?W: [phone] ?[email or web address] ? ?

## 2021-06-08 NOTE — Therapy (Signed)
[place] ?Outpatient Rehabilitation at [facility] ?[address], [address] ?[place], [place], [address] ?Phone: [phone]   Fax:  [phone] ? ?Physical Therapy Treatment ? ?Patient Details  ?Name: Nicole Cantu ?MRN: [id number] ?Date of Birth: 1955-05-06 ?Referring Provider (PT): Dr. Sherlene Shams ? ? ?Encounter Date: 06/08/2021 ? ? PT End of Session - 06/08/21 1610   ? ? Visit Number 11   ? Date for PT Re-Evaluation 06/08/21   ? Authorization Time Period UHC   ? Authorization - Visit Number 11   ? Authorization - Number of Visits 60   ? PT Start Time 1608   ? PT Stop Time 7824   ? PT Time Calculation (min) 42 min   ? Activity Tolerance Patient tolerated treatment well   ? Behavior During Therapy [facility] for tasks assessed/performed   ? ?  ?  ? ?  ? ? ?Past Medical History:  ?Diagnosis Date  ? Anxiety   ? Arrhythmia   ? Holter 01/26/2008-HR [id number].AVG85 rare PVC .pac No adverse arrhythmia  ? Arthritis   ? Back pain   ? Benign intracranial hypertension   ? Dr Jannifer Franklin   ? Chest pain   ? NUC stress - no ischemia  ? Colon polyps   ? Fatigue   ? Hyperlipidemia   ? Hypertension   ? Pulsatile tinnitus of both ears   ? ? ?Past Surgical History:  ?Procedure Laterality Date  ? COLONOSCOPY    ? WISDOM TOOTH EXTRACTION    ? about 15 years ago  ? ? ?There were no vitals filed for this visit. ? ? Subjective Assessment - 06/08/21 1611   ? ? Subjective I see Dr. Wannetta Sender next week. The prolapse has gotten worse since last visit and wearing the pessary more. I have more leaks with the pessary. I have increased stress lately.I am not laying down with the pillow so it hurst my back so I lay without them.   ? Patient Stated Goals reduce leakage, strengthen to assist with prolapse   ? Currently in Pain? Yes   ? Pain Score 4    ? Pain Location Back   ? Pain Orientation Lower   ? Pain Descriptors / Indicators Aching;Spasm   ? Pain Type Chronic pain   ? Pain Onset More than a month ago   ? Pain Frequency Intermittent   ?  Aggravating Factors  random movement   ? Pain Relieving Factors TENS unit   ? Multiple Pain Sites No   ? ?  ?  ? ?  ? ? ? ? ? [facility] PT Assessment - 06/08/21 0001   ? ?  ? Assessment  ? Medical Diagnosis N39.3 SUI   ? Referring Provider (PT) Dr. Sherlene Shams   ? Onset Date/Surgical Date --   2.5 years ago  ? Prior Therapy none   ?  ? Precautions  ? Precautions None   ?  ? Restrictions  ? Weight Bearing Restrictions No   ?  ? Home Environment  ? Living Environment Private residence   ?  ? Prior Function  ? Vocation Retired   ? Leisure swimming 30 minutes, back exercises, gardening   ?  ? Cognition  ? Overall Cognitive Status Within Functional Limits for tasks assessed   ?  ? AROM  ? Overall AROM Comments lumbar ROM is limited by 25% except for flexion is full   ?  ? PROM  ? Right Hip External Rotation  65   ?  Right Hip Internal Rotation  20   ? Left Hip External Rotation  55   after treatment 75  ? Left Hip Internal Rotation  10   after treatment 20  ?  ? Strength  ? Overall Strength Comments weakness in the lower abdominals   ? Right Hip ABduction 4/5   ? Left Hip ABduction 5/5   ? ?  ?  ? ?  ? ? ? ? ? ? ? ? ? ? ? ? ? Pelvic Floor Special Questions - 06/08/21 0001   ? ? Activities that cause leaking With strong urge;Other;Coughing;Sneezing;Laughing;Lifting;Bending   when she has the pessary in  ? Fecal incontinence No   ? Falling out feeling (prolapse) Yes   ? Strength fair squeeze, definite lift   ? ?  ?  ? ?  ? ? ? ? [place] Adult PT Treatment/Exercise - 06/08/21 0001   ? ?  ? Lumbar Exercises: Stretches  ? Active Hamstring Stretch Right;Left;1 rep;60 seconds   ? Active Hamstring Stretch Limitations with contract relax supine with sheet   ?  ? Lumbar Exercises: Supine  ? Other Supine Lumbar Exercises supine feet on stool with hamstring isometric and relax the quads holding for 5 sec 6x; then marching with core control   ?  ? Lumbar Exercises: Quadruped  ? Straight Leg Raise 5 reps   ? Straight Leg Raises  Limitations each side with sliding leg back   ?  ? Manual Therapy  ? Manual Therapy Myofascial release;Soft tissue mobilization;Joint mobilization   ? Manual therapy comments to assess for dry needling   ? Soft tissue mobilization using the addaday to the lumbar, quadratus and hamstring to elongate after dry needling   ? ?  ?  ? ?  ? ? ? Trigger Point Dry Needling - 06/08/21 0001   ? ? Consent Given? Yes   ? Education Handout Provided Previously provided   ? Muscles Treated Lower Quadrant Hamstring   ? Muscles Treated Back/Hip Lumbar multifidi;Quadratus lumborum   ? Hamstring Response Twitch response elicited;Palpable increased muscle length   ? Lumbar multifidi Response Twitch response elicited;Palpable increased muscle length   ? Quadratus Lumborum Response Twitch response elicited;Palpable increased muscle length   right  ? ?  ?  ? ?  ? ? ? ? ? ? ? ? PT Education - 06/08/21 1654   ? ? Education Details Access Code: [email or web address]   ? Person(s) Educated Patient   ? Methods Explanation;Demonstration;Handout   ? Comprehension Verbalized understanding;Returned demonstration   ? ?  ?  ? ?  ? ? ? PT Short Term Goals - 04/11/21 1137   ? ?  ? PT SHORT TERM GOAL #1  ? Title independent with initial HEP for pelvic floor strengthening in supine   ? Time 4   ? Period Weeks   ? Status Achieved   ? Target Date 04/13/21   ?  ? PT SHORT TERM GOAL #2  ? Title understands ways to manage prolapse during the day to reduce chance of prolapse from progressing   ? Time 4   ? Period Weeks   ? Status Achieved   ? ?  ?  ? ?  ? ? ? ? PT Long Term Goals - 06/08/21 1658   ? ?  ? PT LONG TERM GOAL #1  ? Title independent with advanced HEP for pelvic floor and core strength without aggravating her back or left shoulder   ? Time 12   ?  Period Weeks   ? Status On-going   ? Target Date 06/08/21   ?  ? PT LONG TERM GOAL #2  ? Title understand pressure management to reduce strain on the pelvic floor with lifting, daily tasks and gardening   ? Time 12   ?  Period Weeks   ? Status Achieved   ? Target Date 06/08/21   ?  ? PT LONG TERM GOAL #3  ? Title pelvic floor strength >/= 3/5 to reduce her urinary leakage >/= 75% during daily tasks   ? Time 12   ? Period Weeks   ? Status On-going   ? Target Date 06/08/21   ?  ? PT LONG TERM GOAL #4  ? Title wears 1 thin pad daily for just in case due to reduction of daily leakage.   ? Time 12   ? Period Weeks   ? Status On-going   ? Target Date 06/08/21   ? ?  ?  ? ?  ? ? ? ? ? ? ? ? Plan - 06/08/21 1655   ? ? Clinical Impression Statement Patient will have to use her pessary more when she has increased back pain and has increased difficulty with contracting her lower abdominals. She will leak urine when she has the pessary in. After the manual work and dry needling to the back and hamstring she had no pain. She was able to engage her lower abdominals better after manual work. She continues to have weakness in the hips and core. Patient is able to be in quadruped to slide her foot out but unable to lift due to weakness. Patient continues to need skilled therapy to work on core strength and reduce pain so improve urinary leakage and not have her wear her pessary all the time.   ? Personal Factors and Comorbidities Fitness;Past/Current Experience   ? Examination-Activity Limitations Continence;Lift;Carry;Bend   ? Examination-Participation Restrictions Community Activity   ? Stability/Clinical Decision Making Stable/Uncomplicated   ? Rehab Potential Excellent   ? PT Frequency 1x / week   ? PT Duration 12 weeks   ? PT Treatment/Interventions ADLs/Self Care Home Management;Biofeedback;Therapeutic activities;Therapeutic exercise;Neuromuscular re-education;Patient/family education;Manual techniques;Dry needling   ? PT Next Visit Plan continue with dry needling if needed; core work   ? PT Home Exercise Plan Access Code: [email or web address]   ? Recommended Other Services send MD renewal   ? Consulted and Agree with Plan of Care Patient   ? ?  ?  ? ?   ? ? ?Patient will benefit from skilled therapeutic intervention in order to improve the following deficits and impairments:  Decreased endurance, Decreased activity tolerance, Decreased strength, Increased

## 2021-06-15 ENCOUNTER — Encounter: Payer: Self-pay | Admitting: Physical Therapy

## 2021-06-15 ENCOUNTER — Encounter: Payer: 59 | Attending: Obstetrics and Gynecology | Admitting: Physical Therapy

## 2021-06-15 DIAGNOSIS — R278 Other lack of coordination: Secondary | ICD-10-CM

## 2021-06-15 DIAGNOSIS — N393 Stress incontinence (female) (male): Secondary | ICD-10-CM | POA: Insufficient documentation

## 2021-06-15 DIAGNOSIS — M6281 Muscle weakness (generalized): Secondary | ICD-10-CM | POA: Diagnosis present

## 2021-06-15 NOTE — Patient Instructions (Signed)
Access Code: [email or web address] ?URL: [email or web address] ?Date: 06/15/2021 ?Prepared by: Earlie Counts ? ?Program Notes ?lay on back with 2 pillows under hips, pillows under knees like a butterfly for 10 minutes. massage the sides of the vaginal canal with 20 strokes on each side every other day.  ? ?Exercises ?-- Standing Hip Flexion March  - 1 x daily - 7 x weekly - 3 sets - 10 reps ?- Mini Squat with Pelvic Floor Contraction  - 1 x daily - 7 x weekly - 3 sets - 10 reps ?Earlie Counts, PT ?[facility] ?[address], [address] ?[place], [place] [address] ?W: [phone] ?[email or web address] ? ?

## 2021-06-15 NOTE — Therapy (Signed)
[place] ?Outpatient Rehabilitation at [facility] ?[address], [address] ?[place], [place], [address] ?Phone: [phone]   Fax:  [phone] ? ?Physical Therapy Treatment ? ?Patient Details  ?Name: Nicole Cantu ?MRN: [id number] ?Date of Birth: 1956-02-21 ?Referring Provider (PT): Dr. Sherlene Shams ? ? ?Encounter Date: 06/15/2021 ? ? PT End of Session - 06/15/21 1034   ? ? Visit Number 12   ? Date for PT Re-Evaluation 06/08/21   ? Authorization Time Period UHC   ? Authorization - Visit Number 12   ? Authorization - Number of Visits 60   ? PT Start Time 1030   ? PT Stop Time 1115   ? PT Time Calculation (min) 45 min   ? Activity Tolerance Patient tolerated treatment well   ? Behavior During Therapy [facility] for tasks assessed/performed   ? ?  ?  ? ?  ? ? ?Past Medical History:  ?Diagnosis Date  ? Anxiety   ? Arrhythmia   ? Holter 01/26/2008-HR [id number].AVG85 rare PVC .pac No adverse arrhythmia  ? Arthritis   ? Back pain   ? Benign intracranial hypertension   ? Dr Jannifer Franklin   ? Chest pain   ? NUC stress - no ischemia  ? Colon polyps   ? Fatigue   ? Hyperlipidemia   ? Hypertension   ? Pulsatile tinnitus of both ears   ? ? ?Past Surgical History:  ?Procedure Laterality Date  ? COLONOSCOPY    ? WISDOM TOOTH EXTRACTION    ? about 15 years ago  ? ? ?There were no vitals filed for this visit. ? ? Subjective Assessment - 06/15/21 1035   ? ? Subjective See Dr. Wannetta Sender next wek. Prolapse is ard to meassure due to the back hurting and when I use the pessary I leak alot. When the pessary is out no leakage and no urgency.   ? Patient Stated Goals reduce leakage, strengthen to assist with prolapse   ? ?  ?  ? ?  ? ? ? ? ? ? ? ? ? ? ? ? ? ? ? ? ? Pelvic Floor Special Questions - 06/15/21 0001   ? ? Pelvic Floor Internal Exam Patient confirms identificaton and approves PT to assess pelvic floor and treatment   ? Exam Type Vaginal   ? Strength --   initially 2/5 in standing, then was able to do 3/5, marching 2/5 with  left leg going up and 3/5 with right leg going up.  ? ?  ?  ? ?  ? ? ? ? [place] Adult PT Treatment/Exercise - 06/15/21 0001   ? ?  ? Self-Care  ? Self-Care Other Self-Care Comments   ? Other Self-Care Comments  educated patient on pelvic floor biofeedback devices and an article on the dievices.   ?  ? Neuro Re-ed   ? Neuro Re-ed Details  therapist finger in the patients vagina performing pelvic floor contraction, lifting the pelvic floor and engage the lower abdomen, tactile cues to the left side of pelvic floor iwht left lkeg marching to increase contraction, flexing at hips with tactile cues to the pelvic floor   ?  ? Knee/Hip Exercises: Standing  ? Hip Flexion Stengthening;Left;1 set;15 reps   ? Hip Flexion Limitations theraband around the left leg to engage the left side of the pelvic floor and tactile cues to reduce hip rotation   ? Functional Squat 1 set;10 reps   ? Functional Squat Limitations with red band around the left leg  to work on the hip adductors and tactile cues to not rotate the hip   ? ?  ?  ? ?  ? ? ? ? ? ? ? ? ? ? PT Education - 06/15/21 1120   ? ? Education Details Access Code: [email or web address]  education for pelvic floor biofeedback   ? Person(s) Educated Patient   ? Methods Explanation;Demonstration;Other (comment);Tactile cues   ? Comprehension Verbalized understanding;Returned demonstration   ? ?  ?  ? ?  ? ? ? PT Short Term Goals - 04/11/21 1137   ? ?  ? PT SHORT TERM GOAL #1  ? Title independent with initial HEP for pelvic floor strengthening in supine   ? Time 4   ? Period Weeks   ? Status Achieved   ? Target Date 04/13/21   ?  ? PT SHORT TERM GOAL #2  ? Title understands ways to manage prolapse during the day to reduce chance of prolapse from progressing   ? Time 4   ? Period Weeks   ? Status Achieved   ? ?  ?  ? ?  ? ? ? ? PT Long Term Goals - 06/08/21 1658   ? ?  ? PT LONG TERM GOAL #1  ? Title independent with advanced HEP for pelvic floor and core strength without aggravating her back or left  shoulder   ? Time 12   ? Period Weeks   ? Status On-going   ? Target Date 06/08/21   ?  ? PT LONG TERM GOAL #2  ? Title understand pressure management to reduce strain on the pelvic floor with lifting, daily tasks and gardening   ? Time 12   ? Period Weeks   ? Status Achieved   ? Target Date 06/08/21   ?  ? PT LONG TERM GOAL #3  ? Title pelvic floor strength >/= 3/5 to reduce her urinary leakage >/= 75% during daily tasks   ? Time 12   ? Period Weeks   ? Status On-going   ? Target Date 06/08/21   ?  ? PT LONG TERM GOAL #4  ? Title wears 1 thin pad daily for just in case due to reduction of daily leakage.   ? Time 12   ? Period Weeks   ? Status On-going   ? Target Date 06/08/21   ? ?  ?  ? ?  ? ? ? ? ? ? ? ? Plan - 06/15/21 1043   ? ? Clinical Impression Statement Patient will leak in standing when she has the pessary in. She does not leak urine when the pessary is not in. Pelvic floor strength initially was 2/5 but after tactile and verbal cues increased to 3/5 on the right side and 2/5 on the left. She will contract the left side of the pelvic floor better with hip adduction isometric and hands on the pelvis to not rotate. Patient was educated on pelvic floor biofeedback to assist her with her pelvic floor contraction in standing. Patient will benefit from skilled therapy to work on core strength and reduce pain to reduce urinary leakage when she has the pessary in.   ? Personal Factors and Comorbidities Fitness;Past/Current Experience   ? Examination-Activity Limitations Continence;Lift;Carry;Bend   ? Examination-Participation Restrictions Community Activity   ? Stability/Clinical Decision Making Stable/Uncomplicated   ? Rehab Potential Excellent   ? PT Frequency 1x / week   ? PT Duration 12 weeks   ? PT Treatment/Interventions ADLs/Self Care  Home Management;Biofeedback;Therapeutic activities;Therapeutic exercise;Neuromuscular re-education;Patient/family education;Manual techniques;Dry needling   ? PT Next Visit  Plan continue with dry needling if needed; core work, see is she got the biofeedback unit and leakage in standing   ? PT Home Exercise Plan Access Code: [email or web address]   ? Recommended Other Services MD signed all notes   ? Consulted and Agree with Plan of Care Patient   ? ?  ?  ? ?  ? ? ?Patient will benefit from skilled therapeutic intervention in order to improve the following deficits and impairments:  Decreased endurance, Decreased activity tolerance, Decreased strength, Increased fascial restricitons, Decreased coordination ? ?Visit Diagnosis: ?Muscle weakness (generalized) ? ?Other lack of coordination ? ? ? ? ?Problem List ?There are no problems to display for this patient. ? ? ?Earlie Counts, PT ?06/15/21 11:30 AM ? ?[place] ?Outpatient Rehabilitation at [facility] ?[address], [address] ?[place], [place], [address] ?Phone: [phone]   Fax:  [phone] ? ?Name: Kristl Morioka ?MRN: [id number] ?Date of Birth: [birth year] ? ? ? ?

## 2021-06-20 ENCOUNTER — Encounter: Payer: 59 | Admitting: Physical Therapy

## 2021-06-20 ENCOUNTER — Encounter: Payer: Self-pay | Admitting: Physical Therapy

## 2021-06-20 DIAGNOSIS — N393 Stress incontinence (female) (male): Secondary | ICD-10-CM | POA: Diagnosis not present

## 2021-06-20 DIAGNOSIS — M6281 Muscle weakness (generalized): Secondary | ICD-10-CM

## 2021-06-20 DIAGNOSIS — R278 Other lack of coordination: Secondary | ICD-10-CM

## 2021-06-20 NOTE — Therapy (Signed)
[place] ?Outpatient Rehabilitation at [facility] ?[address], [address] ?[place], [place], [address] ?Phone: [phone]   Fax:  [phone] ? ?Physical Therapy Treatment ? ?Patient Details  ?Name: Nicole Cantu ?MRN: [id number] ?Date of Birth: June 22, 1955 ?Referring Provider (PT): Dr. Sherlene Shams ? ? ?Encounter Date: 06/20/2021 ? ? PT End of Session - 06/20/21 1306   ? ? Visit Number 13   ? Date for PT Re-Evaluation 08/31/21   ? Authorization Time Period UHC   ? Authorization - Visit Number 13   ? Authorization - Number of Visits 60   ? PT Start Time 1300   ? PT Stop Time 6270   ? PT Time Calculation (min) 45 min   ? Activity Tolerance Patient tolerated treatment well   ? Behavior During Therapy [facility] for tasks assessed/performed   ? ?  ?  ? ?  ? ? ?Past Medical History:  ?Diagnosis Date  ? Anxiety   ? Arrhythmia   ? Holter 01/26/2008-HR 50-85-145.AVG85 rare PVC .pac No adverse arrhythmia  ? Arthritis   ? Back pain   ? Benign intracranial hypertension   ? Dr Jannifer Franklin   ? Chest pain   ? NUC stress - no ischemia  ? Colon polyps   ? Fatigue   ? Hyperlipidemia   ? Hypertension   ? Pulsatile tinnitus of both ears   ? ? ?Past Surgical History:  ?Procedure Laterality Date  ? COLONOSCOPY    ? WISDOM TOOTH EXTRACTION    ? about 15 years ago  ? ? ?There were no vitals filed for this visit. ? ? Subjective Assessment - 06/20/21 1307   ? ? Subjective I have been doing the red band exercises. I went 4 days without pessar. I htink my back is getting better. I am not as crooked in the morning.   ? Patient Stated Goals reduce leakage, strengthen to assist with prolapse   ? Currently in Pain? Yes   ? Pain Score 3    some days 8/10  ? Pain Location Back   ? Pain Orientation Lower   ? Pain Descriptors / Indicators Aching;Spasm   ? Pain Type Chronic pain   ? Pain Onset More than a month ago   ? Pain Frequency Intermittent   ? Aggravating Factors  random movement   ? Pain Relieving Factors TENS unit   ? Multiple Pain  Sites No   ? ?  ?  ? ?  ? ? ? ? ? ? ? ? ? ? ? ? ? ? ? ? ? ? ? ? [place] Adult PT Treatment/Exercise - 06/20/21 0001   ? ?  ? Lumbar Exercises: Stretches  ? Active Hamstring Stretch Left;2 reps;30 seconds   ? Active Hamstring Stretch Limitations sittng and standing   ?  ? Manual Therapy  ? Manual Therapy Soft tissue mobilization;Myofascial release   ? Manual therapy comments to assess for dry needling   ? Soft tissue mobilization manual work to the left lumbar and gluteal and hamstring to elongate the muscle after dry needling   ? Myofascial Release iastim and suction cup to the left lumbar, gluteal and hamstring to release the fascia   ? ?  ?  ? ?  ? ? ? Trigger Point Dry Needling - 06/20/21 0001   ? ? Consent Given? Yes   ? Education Handout Provided Previously provided   ? Muscles Treated Lower Quadrant Hamstring   left  ? Muscles Treated Back/Hip Gluteus medius;Quadratus lumborum;Lumbar  multifidi   left  ? Hamstring Response Twitch response elicited;Palpable increased muscle length   ? Gluteus Medius Response Twitch response elicited;Palpable increased muscle length   ? Lumbar multifidi Response Twitch response elicited;Palpable increased muscle length   ? Quadratus Lumborum Response Twitch response elicited;Palpable increased muscle length   ? ?  ?  ? ?  ? ? ? ? ? ? ? ? ? ? PT Short Term Goals - 04/11/21 1137   ? ?  ? PT SHORT TERM GOAL #1  ? Title independent with initial HEP for pelvic floor strengthening in supine   ? Time 4   ? Period Weeks   ? Status Achieved   ? Target Date 04/13/21   ?  ? PT SHORT TERM GOAL #2  ? Title understands ways to manage prolapse during the day to reduce chance of prolapse from progressing   ? Time 4   ? Period Weeks   ? Status Achieved   ? ?  ?  ? ?  ? ? ? ? PT Long Term Goals - 06/08/21 1658   ? ?  ? PT LONG TERM GOAL #1  ? Title independent with advanced HEP for pelvic floor and core strength without aggravating her back or left shoulder   ? Time 12   ? Period Weeks   ? Status  On-going   ? Target Date 06/08/21   ?  ? PT LONG TERM GOAL #2  ? Title understand pressure management to reduce strain on the pelvic floor with lifting, daily tasks and gardening   ? Time 12   ? Period Weeks   ? Status Achieved   ? Target Date 06/08/21   ?  ? PT LONG TERM GOAL #3  ? Title pelvic floor strength >/= 3/5 to reduce her urinary leakage >/= 75% during daily tasks   ? Time 12   ? Period Weeks   ? Status On-going   ? Target Date 06/08/21   ?  ? PT LONG TERM GOAL #4  ? Title wears 1 thin pad daily for just in case due to reduction of daily leakage.   ? Time 12   ? Period Weeks   ? Status On-going   ? Target Date 06/08/21   ? ?  ?  ? ?  ? ? ? ? ? ? ? ? Plan - 06/20/21 1310   ? ? Clinical Impression Statement Patient is having less back pain and not waking up in the morning as crooked. Patient continues to leak urine without pessary and is up to a thicker pad. She will be ordering the home biofeedback unit to work on pelvic floor strength. Patient had increased trigger points in the left lumbar paraspinals, gluteals and hamstring. The hamstrings did not feel smooth with manual work due to the restrictions. Patient is walking faster when she comes to therapy. Patient will benefit from skilled therapy to reduce urinary leakage when she has the pessary in.   ? Personal Factors and Comorbidities Fitness;Past/Current Experience   ? Examination-Activity Limitations Continence;Lift;Carry;Bend   ? Examination-Participation Restrictions Community Activity   ? Stability/Clinical Decision Making Stable/Uncomplicated   ? Rehab Potential Excellent   ? PT Frequency 1x / week   ? PT Duration 12 weeks   ? PT Treatment/Interventions ADLs/Self Care Home Management;Biofeedback;Therapeutic activities;Therapeutic exercise;Neuromuscular re-education;Patient/family education;Manual techniques;Dry needling   ? PT Next Visit Plan continue with dry needling if needed; core work, see is she got the biofeedback unit and leakage in  standing; see how it goes with Dr. Wannetta Sender   ? PT Home Exercise Plan Access Code: [email or web address]   ? Consulted and Agree with Plan of Care Patient   ? ?  ?  ? ?  ? ? ?Patient will benefit from skilled therapeutic intervention in order to improve the following deficits and impairments:  Decreased endurance, Decreased activity tolerance, Decreased strength, Increased fascial restricitons, Decreased coordination ? ?Visit Diagnosis: ?Muscle weakness (generalized) ? ?Other lack of coordination ? ? ? ? ?Problem List ?There are no problems to display for this patient. ? ? ?Earlie Counts, PT ?06/20/21 1:55 PM ? ?[place] ?Outpatient Rehabilitation at [facility] ?[address], [address] ?[place], [place], [address] ?Phone: [phone]   Fax:  [phone] ? ?Name: Nicole Cantu ?MRN: [id number] ?Date of Birth: 04-21-1955 ? ? ? ?

## 2021-06-21 ENCOUNTER — Ambulatory Visit (INDEPENDENT_AMBULATORY_CARE_PROVIDER_SITE_OTHER): Payer: 59 | Admitting: Obstetrics and Gynecology

## 2021-06-21 ENCOUNTER — Encounter: Payer: Self-pay | Admitting: Obstetrics and Gynecology

## 2021-06-21 VITALS — BP 143/93 | HR 92

## 2021-06-21 DIAGNOSIS — N8110 Cystocele, unspecified: Secondary | ICD-10-CM | POA: Diagnosis not present

## 2021-06-21 DIAGNOSIS — N3281 Overactive bladder: Secondary | ICD-10-CM

## 2021-06-21 DIAGNOSIS — N393 Stress incontinence (female) (male): Secondary | ICD-10-CM | POA: Diagnosis not present

## 2021-06-21 MED ORDER — MIRABEGRON ER 25 MG PO TB24: 25.0000 mg | ORAL_TABLET | Freq: Every day | ORAL | 5 refills | Status: DC

## 2021-06-21 NOTE — Progress Notes (Signed)
[facility] ? ? ?Subjective:  ?  ? ?Chief Complaint:  ?Chief Complaint  ?Patient presents with  ? Follow-up  ? ?History of Present Illness: ?Nicole Cantu is a 66 y.o. female with stage II pelvic organ prolapse and stress incontinence who presents for a pessary check. She is using a size #4 shaatz pessary.  ? ?She did not take the vesicare because she was concerned about the side effects of dry mouth and cognitive dysfunction. Has been attending pelvic PT. She has noticed the pelvic floor is tight and weak. Has been doing dry needling and massage and her exercises.  ? ?When she wears the pessary, the prolapse is gone but she leaks more, usually was just a little dribble. Can sit on the lawn mower with the pessary in place and does not leak. More recently, just this month, has been having gushes of fluid, especially with walking and bending over.  Has been avoiding bladder irritants and it does no make a difference.  ? ?Has ordered perifit biofeedback device. ? ?Past Medical History: ?Patient  has a past medical history of Anxiety, Arrhythmia, Arthritis, Back pain, Benign intracranial hypertension, Chest pain, Colon polyps, Fatigue, Hyperlipidemia, Hypertension, and Pulsatile tinnitus of both ears.  ? ?Past Surgical History: ?She  has a past surgical history that includes Colonoscopy and Wisdom tooth extraction.  ? ?Medications: ?She has a current medication list which includes the following prescription(s): alprazolam, estradiol, lisinopril-hydrochlorothiazide, simvastatin, solifenacin, turmeric, and zolpidem.  ? ?Allergies: ?Patient has No Known Allergies.  ? ?Social History: ?Patient  reports that she has never smoked. She has never used smokeless tobacco. She reports current alcohol use of about 1.0 standard drink per week. She reports that she does not use drugs.  ? ?  ? ?Objective:  ?  ?Physical Exam: ?BP (!) 143/93   Pulse 92  ?Gen: No apparent distress, A&O x 3. ?Detailed Urogynecologic  Evaluation:  ?deferred ? ? ?POP-Q (12/28/20):  ?  ?POP-Q ?  ?1  ?                                          Aa   ?1 ?                                          Ba   ?1.5  ?                                            C  ?  ?4  ?                                          Gh   ?5  ?                                          Pb   ?6  ?  tvl  ?  ?-2  ?                                          Ap   ?-2  ?                                          Bp   ?-4.5  ?                                            D  ?  ? ?  ? ?Assessment/Plan:  ?  ?Assessment: ?[name] is a 66 y.o. with stage II pelvic organ prolapse , SUI and OAB ? ?Plan: ?- Continue #4 shaatz pessary.  ?- For OAB, prescribed Myrbetriq '25mg'$ . If she does not see improvement, will consider urodynamic testing.  ?- Continue vaginal estrogen twice a week ?- For SUI, continue with pelvic physical therapy. Had not seen improvement with incontinence pessary.  ? ?Return 4-6 weeks for follow up ? ?Jaquita Folds, MD ? ? ?  ? ?

## 2021-06-22 ENCOUNTER — Encounter: Payer: Self-pay | Admitting: Obstetrics and Gynecology

## 2021-06-22 NOTE — Progress Notes (Unsigned)
Submitted PA for Myrbetriq '25mg'$  on Cover my Meds. ?Key: [id number] ?PA Case ID: [id number] ?Rx: [id number] ?Outcome: PENDING  ? ? ?

## 2021-06-27 ENCOUNTER — Encounter: Payer: 59 | Attending: Obstetrics and Gynecology | Admitting: Physical Therapy

## 2021-06-27 ENCOUNTER — Encounter: Payer: Self-pay | Admitting: Physical Therapy

## 2021-06-27 DIAGNOSIS — N393 Stress incontinence (female) (male): Secondary | ICD-10-CM | POA: Insufficient documentation

## 2021-06-27 DIAGNOSIS — R278 Other lack of coordination: Secondary | ICD-10-CM | POA: Diagnosis present

## 2021-06-27 DIAGNOSIS — M6281 Muscle weakness (generalized): Secondary | ICD-10-CM | POA: Diagnosis present

## 2021-06-27 NOTE — Patient Instructions (Signed)
Access Code: [email or web address] ?URL: [email or web address] ?Date: 06/27/2021 ?Prepared by: Earlie Counts ? ?Program Notes ?lay on back under hips, pillows under knees like a butterfly for 10 minutes.  ? ?Exercises ?- Hamstring Set with Swiss Ball  - 1 x daily - 1 x weekly - 1 sets - 10 reps - 5ec hold ?- Supine Hip Adduction Isometric with Ball  - 1 x daily - 7 x weekly - 1 sets - 5 reps ?- Bent Knee Fallouts  - 1 x daily - 7 x weekly - 1 sets - 10 reps ?- Half Kneeling Hip Flexor Stretch with Sidebend  - 1 x daily - 3 x weekly - 1 sets - 2 reps - 30 sec hold ?- Standing Hip Hinge with Dowel  - 1 x daily - 3 x weekly - 1 sets - 10 reps ?- Hip Hinge Rock Back  - 1 x daily - 3 x weekly - 1 sets - 10 reps ?- Supine March with Elevation  - 1 x daily - 7 x weekly - 1 sets - 10 reps ?- Quadruped Yoga Block Lift Off  - 1 x daily - 3 x weekly - 1 sets - 10 reps ?- Quadruped Leg Lifts  - 1 x daily - 3 x weekly - 1 sets - 10 reps ?- Standing Hip Flexion March  - 1 x daily - 3 x weekly - 3 sets - 10 reps ?- Mini Squat with Pelvic Floor Contraction  - 1 x daily - 3 x weekly - 3 sets - 10 reps ?- Standing Hip Abduction  - 1 x daily - 3 x weekly - 2 sets - 10 reps ?- Sit to Stand with Pelvic Floor Contraction  - 1 x daily - 7 x weekly - 1 sets - 10 reps ?- Standing Hip Adduction with Resistance  - 1 x daily - 3 x weekly - 1 sets - 10 reps ?Earlie Counts, PT ?[facility] ?[address], [address] ?[place], [place] [address] ?W: [phone] ?[email or web address] ? ?

## 2021-06-27 NOTE — Therapy (Signed)
[place] ?[facility] at [facility] ?[address], [address] ?[place], [place], [address] ?Phone: [phone]   Fax:  [phone] ? ?Physical Therapy Treatment ? ?Patient Details  ?Name: Nicole Cantu ?MRN: [id number] ?Date of Birth: [birth year] ?Referring Provider (PT): Dr. Sherlene Shams ? ? ?Encounter Date: 06/27/2021 ? ? PT End of Session - 06/27/21 1459   ? ? Visit Number 14   ? Date for PT Re-Evaluation 08/31/21   ? Authorization Time Period [employer]   ? Authorization - Visit Number 14   ? Authorization - Number of Visits 60   ? PT Start Time 1130   ? PT Stop Time 3810   ? PT Time Calculation (min) 45 min   ? Activity Tolerance Patient tolerated treatment well   ? Behavior During Therapy [facility] for tasks assessed/performed   ? ?  ?  ? ?  ? ? ?Past Medical History:  ?Diagnosis Date  ? Anxiety   ? Arrhythmia   ? Holter 01/26/2008-HR [id number] rare PVC .pac No adverse arrhythmia  ? Arthritis   ? Back pain   ? Benign intracranial hypertension   ? Dr Jannifer Franklin   ? Chest pain   ? NUC stress - no ischemia  ? Colon polyps   ? Fatigue   ? Hyperlipidemia   ? Hypertension   ? Pulsatile tinnitus of both ears   ? ? ?Past Surgical History:  ?Procedure Laterality Date  ? COLONOSCOPY    ? WISDOM TOOTH EXTRACTION    ? about 15 years ago  ? ? ?There were no vitals filed for this visit. ? ? Subjective Assessment - 06/27/21 1123   ? ? Subjective I got the device. I used it [place] and monday. When I have back pain I am pressing down on my pelvic floor. I have used the perfit 2 times. I only use the pessary when I mow. At the 4th day wihtout the pessary the prolapse bothers me. Today my back pain is feeling better.   ? Patient Stated Goals reduce leakage, strengthen to assist with prolapse   ? Currently in Pain? No/denies   ? Multiple Pain Sites No   ? ?  ?  ? ?  ? ? ? ? ? ? ? ? ? ? ? ? ? ? ? ? ? ? ? ? [place] Adult PT Treatment/Exercise - 06/27/21 0001   ? ?  ? Self-Care  ? Self-Care Other Self-Care Comments   ?  Other Self-Care Comments  discussed the home biofeedback unit an dwhen to use it and what positions   ?  ? Exercises  ? Exercises Other Exercises   ? Other Exercises  reviewed the HEP and went over the order to do the exercises and wheich exercises she is to use her biofeedback unit with   ?  ? Lumbar Exercises: Stretches  ? Hip Flexor Stretch Right;Left;1 rep;30 seconds   ? Hip Flexor Stretch Limitations in half kneel with arm overhead.   ?  ? Lumbar Exercises: Supine  ? Clam 10 reps;1 second   ? Clam Limitations with red band around knees   ? Bent Knee Raise 10 reps;1 second   Right, left  ?  ? Knee/Hip Exercises: Standing  ? Hip Flexion Right;Left;1 set;10 reps   ? Hip ADduction Strengthening;Right;Left;1 set;10 reps   ? Hip ADduction Limitations with red band   ?  ? Manual Therapy  ? Manual Therapy Soft tissue mobilization   ? Manual therapy comments to assess for  dry needling   ? Soft tissue mobilization using the addaday to the lumbar and gluteal to elongate the muscles after dry needling   ? ?  ?  ? ?  ? ? ? Trigger Point Dry Needling - 06/27/21 0001   ? ? Consent Given? Yes   ? Education Handout Provided Previously provided   ? Muscles Treated Back/Hip Gluteus medius;Lumbar multifidi;Quadratus lumborum   ? Gluteus Medius Response Twitch response elicited;Palpable increased muscle length   ? Lumbar multifidi Response Twitch response elicited;Palpable increased muscle length   ? Quadratus Lumborum Response Twitch response elicited;Palpable increased muscle length   ? ?  ?  ? ?  ? ? ? ? ? ? ? ? PT Education - 06/27/21 1359   ? ? Education Details went over her HEP and added when she is to use the perfit.Access Code: [email or web address]   ? Person(s) Educated Patient   ? Methods Explanation;Demonstration;Handout   ? Comprehension Verbalized understanding;Returned demonstration   ? ?  ?  ? ?  ? ? ? PT Short Term Goals - 04/11/21 1137   ? ?  ? PT SHORT TERM GOAL #1  ? Title independent with initial HEP for pelvic floor  strengthening in supine   ? Time 4   ? Period Weeks   ? Status Achieved   ? Target Date 04/13/21   ?  ? PT SHORT TERM GOAL #2  ? Title understands ways to manage prolapse during the day to reduce chance of prolapse from progressing   ? Time 4   ? Period Weeks   ? Status Achieved   ? ?  ?  ? ?  ? ? ? ? PT Long Term Goals - 06/27/21 1459   ? ?  ? PT LONG TERM GOAL #1  ? Title independent with advanced HEP for pelvic floor and core strength without aggravating her back or left shoulder   ? Time 12   ? Period Weeks   ? Status On-going   ? Target Date 06/08/21   ?  ? PT LONG TERM GOAL #2  ? Title understand pressure management to reduce strain on the pelvic floor with lifting, daily tasks and gardening   ? Time 12   ? Period Weeks   ? Status Achieved   ?  ? PT LONG TERM GOAL #3  ? Title pelvic floor strength >/= 3/5 to reduce her urinary leakage >/= 75% during daily tasks   ? Baseline using the perfit   ? Time 12   ? Period Weeks   ? Status On-going   ?  ? PT LONG TERM GOAL #4  ? Title wears 1 thin pad daily for just in case due to reduction of daily leakage.   ? Time 12   ? Period Weeks   ? Status On-going   ? Target Date 06/08/21   ? ?  ?  ? ?  ? ? ? ? ? ? ? ? Plan - 06/27/21 1455   ? ? Clinical Impression Statement Patient is able to not wear her pessary for 4 days then she will feel the prolapse. She has gotten the Perfit and using it for pelvic floor strengthening. She has realized when she has back pain she will bulge her pelvic floor more. Her back is feeling better and she is able to wake up and not be as crooked. Patient understands when to exercise with the perfit in. Patient will benefit from skilled therapy to reduce her urinary  leaksage when she has the pessary in.   ? Personal Factors and Comorbidities Fitness;Past/Current Experience   ? Examination-Activity Limitations Continence;Lift;Carry;Bend   ? Examination-Participation Restrictions Community Activity   ? Stability/Clinical Decision Making  Stable/Uncomplicated   ? Rehab Potential Excellent   ? PT Frequency 1x / week   ? PT Duration 12 weeks   ? PT Treatment/Interventions ADLs/Self Care Home Management;Biofeedback;Therapeutic activities;Therapeutic exercise;Neuromuscular re-education;Patient/family education;Manual techniques;Dry needling   ? PT Next Visit Plan continue with dry needling, see how the perfit is doing   ? PT Home Exercise Plan Access Code: [email or web address]   ? Consulted and Agree with Plan of Care Patient   ? ?  ?  ? ?  ? ? ?Patient will benefit from skilled therapeutic intervention in order to improve the following deficits and impairments:  Decreased endurance, Decreased activity tolerance, Decreased strength, Increased fascial restricitons, Decreased coordination ? ?Visit Diagnosis: ?Muscle weakness (generalized) ? ?Other lack of coordination ? ? ? ? ?Problem List ?There are no problems to display for this patient. ? ? ?Earlie Counts, PT ?06/27/21 3:00 PM ? ?[place] ?Outpatient Rehabilitation at [facility] ?[address], [address] ?[place], [place], [address] ?Phone: [phone]   Fax:  [phone] ? ?Name: Nicole Cantu ?MRN: [id number] ?Date of Birth: Feb 23, 1956 ? ? ? ?

## 2021-07-18 ENCOUNTER — Encounter: Payer: Self-pay | Admitting: Physical Therapy

## 2021-07-18 ENCOUNTER — Encounter: Payer: 59 | Admitting: Physical Therapy

## 2021-07-18 DIAGNOSIS — M6281 Muscle weakness (generalized): Secondary | ICD-10-CM

## 2021-07-18 DIAGNOSIS — R278 Other lack of coordination: Secondary | ICD-10-CM

## 2021-07-18 DIAGNOSIS — N393 Stress incontinence (female) (male): Secondary | ICD-10-CM | POA: Diagnosis not present

## 2021-07-18 NOTE — Therapy (Signed)
[place] [facility] [address], [place], [place], [address] Phone: [phone]   Fax:  [phone]  Physical Therapy Treatment  Patient Details  Name: Seletha Zimmermann MRN: [id number] Date of Birth: 18-Feb-1956 Referring Provider (PT): Dr. Sherlene Shams   Encounter Date: 07/18/2021   PT End of Session - 07/18/21 0940     Visit Number 15    Date for PT Re-Evaluation 08/31/21    Authorization Time [place]    Authorization - Visit Number 15    Authorization - Number of Visits 60    PT Start Time 0930    PT Stop Time 1025    PT Time Calculation (min) 55 min    Activity Tolerance Patient tolerated treatment well    Behavior During Therapy [facility] for tasks assessed/performed             Past Medical History:  Diagnosis Date   Anxiety    Arrhythmia    Holter 01/26/2008-HR [id number] rare PVC .pac No adverse arrhythmia   Arthritis    Back pain    Benign intracranial hypertension    Dr Jannifer Franklin    Chest pain    NUC stress - no ischemia   Colon polyps    Fatigue    Hyperlipidemia    Hypertension    Pulsatile tinnitus of both ears     Past Surgical History:  Procedure Laterality Date   COLONOSCOPY     WISDOM TOOTH EXTRACTION     about 15 years ago    There were no vitals filed for this visit.   Subjective Assessment - 07/18/21 0941     Subjective When I use the pessary I have a constant drip so I only use it when I have to. The back pain is still there.    Patient Stated Goals reduce leakage, strengthen to assist with prolapse    Currently in Pain? Yes    Pain Score 4     Pain Location Back    Pain Orientation Lower    Pain Descriptors / Indicators Aching;Spasm    Pain Type Chronic pain    Pain Onset More than a month ago    Pain Frequency Intermittent    Aggravating Factors  random movement    Pain Relieving Factors TENS unit    Multiple Pain Sites No                             Pelvic Floor Special Questions - 07/18/21 0001     Pelvic Floor Internal Exam Patient confirms identificaton and approves PT to assess pelvic floor and treatment    Exam Type Vaginal    Strength fair squeeze, definite lift   very small lift with tactile cues              [facility] Adult PT Treatment/Exercise - 07/18/21 0001       Neuro Re-ed    Neuro Re-ed Details  diaphragmatic breathing in sitting with not lifting her shoulders and feeling the pelvic flfoor relax; pelvic floro contraciton with external assistance to move the pelvic floor upward, stroking of the muscles to improve the hug and lift to the muscles      Manual Therapy   Manual Therapy Myofascial release;Internal Pelvic Floor    Myofascial Release release of the urogenital diaphragm while patient perfromed diaphragmatic breathing and going through the fascial layers    Internal Pelvic Floor release fo  the left side of the urterus and bladder in supine and right sidely; relase fo the right side of the bladder in supine; manual work along the pubovaginalis and puborectalis;                       PT Short Term Goals - 04/11/21 1137       PT SHORT TERM GOAL #1   Title independent with initial HEP for pelvic floor strengthening in supine    Time 4    Period Weeks    Status Achieved    Target Date 04/13/21      PT SHORT TERM GOAL #2   Title understands ways to manage prolapse during the day to reduce chance of prolapse from progressing    Time 4    Period Weeks    Status Achieved               PT Long Term Goals - 06/27/21 1459       PT LONG TERM GOAL #1   Title independent with advanced HEP for pelvic floor and core strength without aggravating her back or left shoulder    Time 12    Period Weeks    Status On-going    Target Date 06/08/21      PT LONG TERM GOAL #2   Title understand pressure management to reduce strain on the pelvic floor with lifting,  daily tasks and gardening    Time 12    Period Weeks    Status Achieved      PT LONG TERM GOAL #3   Title pelvic floor strength >/= 3/5 to reduce her urinary leakage >/= 75% during daily tasks    Baseline using the perfit    Time 12    Period Weeks    Status On-going      PT LONG TERM GOAL #4   Title wears 1 thin pad daily for just in case due to reduction of daily leakage.    Time 12    Period Weeks    Status On-going    Target Date 06/08/21                   Plan - 07/18/21 1207     Clinical Impression Statement Patient continues to have continous urinary leakage when she is wearing the pessary. She is using the home perifit to work on her pelvic floor contraction with tactile cues. Initially she was not able to contract her pelvic floro but after the manual work and tactile cues she went to 3/5 with a very weak lift of the pelvic floor. Patient will be talking to her doctor about her back pain since this effect the pelvic floor when she has pain. Patient had increased fascial restrictions on the left side of the bladder and cervix. Patient will benefit from skilled therapy to reduce her urinary leakage when she has the pessary in.    Personal Factors and Comorbidities Fitness;Past/Current Experience    Examination-Activity Limitations Continence;Lift;Carry;Bend    Examination-Participation Restrictions Community Activity    Stability/Clinical Decision Making Stable/Uncomplicated    Rehab Potential Excellent    PT Frequency 1x / week    PT Duration 12 weeks    PT Treatment/Interventions ADLs/Self Care Home Management;Biofeedback;Therapeutic activities;Therapeutic exercise;Neuromuscular re-education;Patient/family education;Manual techniques;Dry needling    PT Next Visit Plan continue with dry needling if needed; manual work to the left side of the bladder and cervix, work on the pelvic floor contraction  in supine then standing see how the perfit is doing    PT Home  Exercise Plan Access Code: [email or web address]    Consulted and Agree with Plan of Care Patient             Patient will benefit from skilled therapeutic intervention in order to improve the following deficits and impairments:  Decreased endurance, Decreased activity tolerance, Decreased strength, Increased fascial restricitons, Decreased coordination  Visit Diagnosis: Muscle weakness (generalized)  Other lack of coordination     Problem List There are no problems to display for this patient.   Earlie Counts, PT 07/18/21 12:14 PM  [place] [facility] [address], [place], [place], [address] Phone: [phone]   Fax:  [phone]  Name: Keliyah Spillman MRN: [id number] Date of Birth: 11-Oct-1955

## 2021-07-25 ENCOUNTER — Encounter: Payer: 59 | Admitting: Physical Therapy

## 2021-07-27 ENCOUNTER — Encounter: Payer: 59 | Attending: Obstetrics and Gynecology | Admitting: Physical Therapy

## 2021-07-27 ENCOUNTER — Encounter: Payer: Self-pay | Admitting: Physical Therapy

## 2021-07-27 DIAGNOSIS — R278 Other lack of coordination: Secondary | ICD-10-CM

## 2021-07-27 DIAGNOSIS — M6281 Muscle weakness (generalized): Secondary | ICD-10-CM

## 2021-07-27 NOTE — Therapy (Signed)
[place] at [facility] [address] [place], [place], [address] Phone: [phone]   Fax:  [phone]  Physical Therapy Treatment  Patient Details  Name: Nicole Cantu MRN: [id number] Date of Birth: December 16, 1955 Referring Provider (PT): Dr. Sherlene Shams   Encounter Date: 07/27/2021   PT End of Session - 07/27/21 1307     Visit Number 16    Date for PT Re-Evaluation 08/31/21    Authorization [place] - Visit Number 16    Authorization - Number of Visits 60    PT Start Time 1300    PT Stop Time 1345    PT Time Calculation (min) 45 min    Activity Tolerance Patient tolerated treatment well    Behavior During Therapy [facility] for tasks assessed/performed             Past Medical History:  Diagnosis Date   Anxiety    Arrhythmia    Holter 01/26/2008-HR [id number].[id number] rare PVC .pac No adverse arrhythmia   Arthritis    Back pain    Benign intracranial hypertension    Dr Jannifer Franklin    Chest pain    NUC stress - no ischemia   Colon polyps    Fatigue    Hyperlipidemia    Hypertension    Pulsatile tinnitus of both ears     Past Surgical History:  Procedure Laterality Date   COLONOSCOPY     WISDOM TOOTH EXTRACTION     about 15 years ago    There were no vitals filed for this visit.   Subjective Assessment - 07/27/21 1307     Subjective I will have a MRI tomorrow night. In themorning it has become difficulty to walk. Using the biofeedback uinit shows good endurance, trouble relaxing, control of pelvic floor.Patient does better with the pelvic floor biofeedback when she is having less back pain.    Patient Stated Goals reduce leakage, strengthen to assist with prolapse    Currently in Pain? Yes    Pain Score 9     Pain Location Back    Pain Orientation Lower    Pain Descriptors / Indicators Aching;Spasm    Pain Type Chronic pain    Pain Radiating Towards goes into left leg    Pain Onset More  than a month ago    Pain Frequency Intermittent    Aggravating Factors  random movement    Pain Relieving Factors TENS    Multiple Pain Sites No                            Pelvic Floor Special Questions - 07/27/21 0001     Pelvic Floor Internal Exam Patient confirms identificaton and approves PT to assess pelvic floor and treatment    Exam Type Vaginal    Strength fair squeeze, definite lift   in sidely and standing              [facility] Adult PT Treatment/Exercise - 07/27/21 0001       Self-Care   Self-Care Other Self-Care Comments    Other Self-Care Comments  discussed with patient on how the back can affect the nerves that go to the pelvic floor.      Neuro Re-ed    Neuro Re-ed Details  pelvic floor contraction in sidely with hug of the therapist finger and lift, standing working on pelvic floor contraction wiht feet apart, feet staggered  and squeeze a yoga block to work on contraction      Manual Therapy   Manual Therapy Myofascial release;Internal Pelvic Floor    Internal Pelvic Floor release of the attachment of the puborectalis, pubovaginalis and side of the bladder, rellase of the obturator intenist on the left in left  sidely,                       PT Short Term Goals - 04/11/21 1137       PT SHORT TERM GOAL #1   Title independent with initial HEP for pelvic floor strengthening in supine    Time 4    Period Weeks    Status Achieved    Target Date 04/13/21      PT SHORT TERM GOAL #2   Title understands ways to manage prolapse during the day to reduce chance of prolapse from progressing    Time 4    Period Weeks    Status Achieved               PT Long Term Goals - 07/27/21 1404       PT LONG TERM GOAL #1   Title independent with advanced HEP for pelvic floor and core strength without aggravating her back or left shoulder    Time 12    Period Weeks    Status On-going    Target Date 06/08/21      PT LONG TERM GOAL  #2   Title understand pressure management to reduce strain on the pelvic floor with lifting, daily tasks and gardening    Time 12    Period Weeks    Status Achieved    Target Date 06/08/21      PT LONG TERM GOAL #3   Title pelvic floor strength >/= 3/5 to reduce her urinary leakage >/= 75% during daily tasks    Baseline using the perfit, first time to fill the pelvic floro hug therapis finger and lift the pelvic floor in standing    Time 12    Period Weeks    Status On-going    Target Date 06/08/21      PT LONG TERM GOAL #4   Title wears 1 thin pad daily for just in case due to reduction of daily leakage.    Time 12    Period Weeks    Status On-going    Target Date 06/08/21                   Plan - 07/27/21 1331     Clinical Impression Statement Patient is goingto have a MRI of her back. She is starting to have trouble placing weight on her left leg in the morning. Pelvic floor strength in sidely is 3/5 with good hug and lift but has difficulty with relaxation. Pelvic floor in standing is 3/5 with hug and weak lift when she is able to slightly  adduct her hips. She was able to feel this for the first time. Patient has diffiicult with endurance and coordination of the pelvic floor while using her home biofeedback for the pelvic floor. Patient will benefi tfrom skilled therapy to reduce her urinary leakage when she has the pessary in.    Personal Factors and Comorbidities Fitness;Past/Current Experience    Examination-Activity Limitations Continence;Lift;Carry;Bend    Examination-Participation Restrictions Community Activity    Stability/Clinical Decision Making Stable/Uncomplicated    Rehab Potential Excellent    PT Frequency 1x / week  PT Duration 12 weeks    PT Treatment/Interventions ADLs/Self Care Home Management;Biofeedback;Therapeutic activities;Therapeutic exercise;Neuromuscular re-education;Patient/family education;Manual techniques;Dry needling    PT Next Visit  Plan See what MRI says about her back and work on the pelvic floor relaxation and contraction    PT Home Exercise Plan Access Code: [email or web address]    Consulted and Agree with Plan of Care Patient             Patient will benefit from skilled therapeutic intervention in order to improve the following deficits and impairments:  Decreased endurance, Decreased activity tolerance, Decreased strength, Increased fascial restricitons, Decreased coordination  Visit Diagnosis: Muscle weakness (generalized)  Other lack of coordination     Problem List There are no problems to display for this patient.   Earlie Counts, PT 07/27/21 2:06 PM  [place] [facility] at [facility] [address], [place], [place], [address] Phone: [phone]   Fax:  [phone]  Name: Nicole Cantu MRN: [id number] Date of Birth: 02-23-1956

## 2021-08-01 ENCOUNTER — Encounter: Payer: 59 | Admitting: Physical Therapy

## 2021-08-01 ENCOUNTER — Encounter: Payer: Self-pay | Admitting: Physical Therapy

## 2021-08-01 DIAGNOSIS — M6281 Muscle weakness (generalized): Secondary | ICD-10-CM

## 2021-08-01 DIAGNOSIS — R278 Other lack of coordination: Secondary | ICD-10-CM

## 2021-08-01 NOTE — Therapy (Signed)
[place] at [facility] [address] [place], [place], [address] Phone: [phone]   Fax:  [phone]  Physical Therapy Treatment  Patient Details  Name: Nicole Cantu MRN: [id number] Date of Birth: 01-21-1956 Referring Provider (PT): Dr. Sherlene Shams   Encounter Date: 08/01/2021   PT End of Session - 08/01/21 1125     Visit Number 17    Date for PT Re-Evaluation 08/31/21    Authorization Time [place] - Visit Number 17    Authorization - Number of Visits 60    PT Start Time 1120    PT Stop Time 1215    PT Time Calculation (min) 55 min    Activity Tolerance Patient tolerated treatment well    Behavior During Therapy [facility] for tasks assessed/performed             Past Medical History:  Diagnosis Date   Anxiety    Arrhythmia    Holter 01/26/2008-HR [id number].[id number] rare PVC .pac No adverse arrhythmia   Arthritis    Back pain    Benign intracranial hypertension    Dr Jannifer Franklin    Chest pain    NUC stress - no ischemia   Colon polyps    Fatigue    Hyperlipidemia    Hypertension    Pulsatile tinnitus of both ears     Past Surgical History:  Procedure Laterality Date   COLONOSCOPY     WISDOM TOOTH EXTRACTION     about 15 years ago    There were no vitals filed for this visit.   Subjective Assessment - 08/01/21 1125     Subjective I saw my doctor and had the MRI. I will be taking Gabapentin at night. Yesterday I had a pain free day. I did use   the pessary one day . this week. I had less leakage with the pessary. I have not pain today.    Patient Stated Goals reduce leakage, strengthen to assist with prolapse    Currently in Pain? No/denies                            Pelvic Floor Special Questions - 08/01/21 0001     Pelvic Floor Internal Exam Patient confirms identificaton and approves PT to assess pelvic floor and treatment    Exam Type Vaginal    Strength fair  squeeze, definite lift               [facility] Adult PT Treatment/Exercise - 08/01/21 0001       Neuro Re-ed    Neuro Re-ed Details  pwlvic floro contracton with finger internally to give tactile cues, tactile cues to the abdomen to work on the contraction with the lift of the pelvic floor so she is not bearng down, tactile cues to fully relax; supine macrching with therapist giving resistance to hip adduction with hip flexion to contract the pelvic floro better.      Lumbar Exercises: Supine   Bent Knee Raise 10 reps;1 second    Bent Knee Raise Limitations with yellow band to contract the pelvic floro better      Knee/Hip Exercises: Standing   Hip Flexion Left;1 set;10 reps    Hip Flexion Limitations with yellow band giving resistance to hip adduction and pelvic floor contraction    Functional Squat 5 reps    Functional Squat Limitations with yellow band resisting hip adduction  Manual Therapy   Manual Therapy Internal Pelvic Floor    Internal Pelvic Floor manual work on the urogenital diaphragm, along the introitus, alon gthe levator ani                       PT Short Term Goals - 04/11/21 1137       PT SHORT TERM GOAL #1   Title independent with initial HEP for pelvic floor strengthening in supine    Time 4    Period Weeks    Status Achieved    Target Date 04/13/21      PT SHORT TERM GOAL #2   Title understands ways to manage prolapse during the day to reduce chance of prolapse from progressing    Time 4    Period Weeks    Status Achieved               PT Long Term Goals - 07/27/21 1404       PT LONG TERM GOAL #1   Title independent with advanced HEP for pelvic floor and core strength without aggravating her back or left shoulder    Time 12    Period Weeks    Status On-going    Target Date 06/08/21      PT LONG TERM GOAL #2   Title understand pressure management to reduce strain on the pelvic floor with lifting, daily tasks and gardening     Time 12    Period Weeks    Status Achieved    Target Date 06/08/21      PT LONG TERM GOAL #3   Title pelvic floor strength >/= 3/5 to reduce her urinary leakage >/= 75% during daily tasks    Baseline using the perfit, first time to fill the pelvic floro hug therapis finger and lift the pelvic floor in standing    Time 12    Period Weeks    Status On-going    Target Date 06/08/21      PT LONG TERM GOAL #4   Title wears 1 thin pad daily for just in case due to reduction of daily leakage.    Time 12    Period Weeks    Status On-going    Target Date 06/08/21                   Plan - 08/01/21 1216     Clinical Impression Statement Patient reports she had less leakage with the pessary since last week. She has only had to use the pessary 1 time last week. Pelvic floor strength is 3/5. She needs tactile cues to contract thepelvic floor with a lift and adding the abdominal contraction. Patient had the MRI of her lumbar and showed many areas of stenosis. MD is helping her with her back pain. Patient will benefit from skilled therapy to reduce her urinary leakage when she has the pessary in.    Personal Factors and Comorbidities Fitness;Past/Current Experience    Examination-Activity Limitations Continence;Lift;Carry;Bend    Examination-Participation Restrictions Community Activity    Stability/Clinical Decision Making Stable/Uncomplicated    Rehab Potential Excellent    PT Frequency 1x / week    PT Duration 12 weeks    PT Treatment/Interventions ADLs/Self Care Home Management;Biofeedback;Therapeutic activities;Therapeutic exercise;Neuromuscular re-education;Patient/family education;Manual techniques;Dry needling    PT Next Visit Plan 1/2 kneel with trunk rotation and band, see how the perifit is in standing    PT Home Exercise Plan Access Code: [email or web address]  Consulted and Agree with Plan of Care Patient             Patient will benefit from skilled therapeutic intervention  in order to improve the following deficits and impairments:  Decreased endurance, Decreased activity tolerance, Decreased strength, Increased fascial restricitons, Decreased coordination  Visit Diagnosis: Muscle weakness (generalized)  Other lack of coordination     Problem List There are no problems to display for this patient.   Earlie Counts, PT 08/01/21 12:21 PM  [place] [facility] at [facility] [address] [place], [place], [address] Phone: [phone]   Fax:  [phone]  Name: Nicole Cantu MRN: [id number] Date of Birth: [birth year]

## 2021-08-03 ENCOUNTER — Encounter: Payer: 59 | Admitting: Physical Therapy

## 2021-08-03 ENCOUNTER — Encounter: Payer: Self-pay | Admitting: Physical Therapy

## 2021-08-10 ENCOUNTER — Encounter: Payer: Self-pay | Admitting: Physical Therapy

## 2021-08-10 ENCOUNTER — Encounter: Payer: 59 | Admitting: Physical Therapy

## 2021-08-10 DIAGNOSIS — M6281 Muscle weakness (generalized): Secondary | ICD-10-CM | POA: Diagnosis not present

## 2021-08-10 DIAGNOSIS — R278 Other lack of coordination: Secondary | ICD-10-CM

## 2021-08-10 NOTE — Patient Instructions (Signed)
Access Code: [email or web address] URL: [email or web address] Date: 08/10/2021 Prepared by: Earlie Counts  Program Notes lay on back under hips, pillows under knees like a butterfly for 10 minutes.   Exercises - Seated Hip Adduction Isometrics with Ball  - 1 x daily - 3 x weekly - 2 sets - 10 reps - Alternating Shoulder Flexion Seated on Swiss Ball  - 1 x daily - 3 x weekly - 2 sets - 10 reps - Standing Anti-Rotation Press with Anchored Resistance  - 1 x daily - 3 x weekly - 2 sets - 10 reps Earlie Counts, PT [facility] [address], [place], [place] [address] W: [phone] [email or web address]

## 2021-08-10 NOTE — Therapy (Signed)
[place] at [facility] [address] [place], [place], [address] Phone: [phone]   Fax:  [phone]  Physical Therapy Treatment  Patient Details  Name: Nicole Cantu MRN: [id number] Date of Birth: Jun 28, 1955 Referring Provider (PT): Dr. Sherlene Shams   Encounter Date: 08/10/2021   PT End of Session - 08/10/21 1035     Visit Number 18    Date for PT Re-Evaluation 08/31/21    Authorization Time [place] - Visit Number 18    Authorization - Number of Visits 60    PT Start Time 2878    PT Stop Time 1115    PT Time Calculation (min) 45 min    Activity Tolerance Patient tolerated treatment well    Behavior During Therapy [facility] for tasks assessed/performed             Past Medical History:  Diagnosis Date   Anxiety    Arrhythmia    Holter 01/26/2008-HR [id number].[id number] rare PVC .pac No adverse arrhythmia   Arthritis    Back pain    Benign intracranial hypertension    Dr Jannifer Franklin    Chest pain    NUC stress - no ischemia   Colon polyps    Fatigue    Hyperlipidemia    Hypertension    Pulsatile tinnitus of both ears     Past Surgical History:  Procedure Laterality Date   COLONOSCOPY     WISDOM TOOTH EXTRACTION     about 15 years ago    There were no vitals filed for this visit.   Subjective Assessment - 08/10/21 1037     Subjective I felt better after I left therapy. I left with no pain. When I put the pessary in I still dribble and leak. The strength, endurance and control are not good with the pelvic EMG. The prlapse is not as noticable and is better. Back pain is not coming as oftern since on thenew medication. The medication helps me sleep.    Patient Stated Goals reduce leakage, strengthen to assist with prolapse    Currently in Pain? No/denies                               [facility] Adult PT Treatment/Exercise - 08/10/21 0001       Lumbar Exercises: Seated    Other Seated Lumbar Exercises sitting squeeze yoga block with antirotational movement  engaging the core and pelvic floor; sittng with squeezing yoga block between knee s alterante shoulder flexion with 1# wt in hands, sitting squeeze yoga block between knees and hip flexion resistance                     PT Education - 08/10/21 1119     Education Details Access Code: [email or web address]    Person(s) Educated Patient    Methods Explanation;Demonstration;Handout    Comprehension Returned demonstration;Verbalized understanding              PT Short Term Goals - 04/11/21 1137       PT SHORT TERM GOAL #1   Title independent with initial HEP for pelvic floor strengthening in supine    Time 4    Period Weeks    Status Achieved    Target Date 04/13/21      PT SHORT TERM GOAL #2   Title understands ways to manage prolapse during the day to reduce chance  of prolapse from progressing    Time 4    Period Weeks    Status Achieved               PT Long Term Goals - 07/27/21 1404       PT LONG TERM GOAL #1   Title independent with advanced HEP for pelvic floor and core strength without aggravating her back or left shoulder    Time 12    Period Weeks    Status On-going    Target Date 06/08/21      PT LONG TERM GOAL #2   Title understand pressure management to reduce strain on the pelvic floor with lifting, daily tasks and gardening    Time 12    Period Weeks    Status Achieved    Target Date 06/08/21      PT LONG TERM GOAL #3   Title pelvic floor strength >/= 3/5 to reduce her urinary leakage >/= 75% during daily tasks    Baseline using the perfit, first time to fill the pelvic floro hug therapis finger and lift the pelvic floor in standing    Time 12    Period Weeks    Status On-going    Target Date 06/08/21      PT LONG TERM GOAL #4   Title wears 1 thin pad daily for just in case due to reduction of daily leakage.    Time 12    Period Weeks    Status On-going     Target Date 06/08/21                   Plan - 08/10/21 1051     Clinical Impression Statement Patient continues to leak when the pessary is inserted. She does not leak when the pessary is not in. Patient feels her prolapse is doing better so she does not need the pessary as much. She will have more trouble with pelvic floor contraction in standing so exercises focused on sitting with abdominal and pelvic floor contraction. She needed tactile cues to engage the lower abdomen and pelvic floor. Patient continues to use her pelvic floor home EMG. Patient will benefit from skilled therapy to reduce her urinary leakage when she has the pessary in.    Personal Factors and Comorbidities Fitness;Past/Current Experience    Examination-Activity Limitations Continence;Lift;Carry;Bend    Examination-Participation Restrictions Community Activity    Stability/Clinical Decision Making Stable/Uncomplicated    Rehab Potential Excellent    PT Frequency 1x / week    PT Duration 12 weeks    PT Treatment/Interventions ADLs/Self Care Home Management;Biofeedback;Therapeutic activities;Therapeutic exercise;Neuromuscular re-education;Patient/family education;Manual techniques;Dry needling    PT Next Visit Plan see how the sitting exercises are doing, progress them,    PT Home Exercise Plan Access Code: [email or web address]    Consulted and Agree with Plan of Care Patient             Patient will benefit from skilled therapeutic intervention in order to improve the following deficits and impairments:  Decreased endurance, Decreased activity tolerance, Decreased strength, Increased fascial restricitons, Decreased coordination  Visit Diagnosis: Muscle weakness (generalized)  Other lack of coordination     Problem List There are no problems to display for this patient.   Earlie Counts, PT 08/10/21 11:26 AM   [place] [facility] at [facility] [address] [place], [place], [address] Phone: [phone]   Fax:  [phone]  Name: Cayli Escajeda MRN: [id number] Date of  Birth: [birth year]

## 2021-08-11 ENCOUNTER — Encounter: Payer: Self-pay | Admitting: Physical Therapy

## 2021-08-15 ENCOUNTER — Encounter: Payer: Self-pay | Admitting: Physical Therapy

## 2021-08-15 ENCOUNTER — Encounter: Payer: 59 | Admitting: Physical Therapy

## 2021-08-15 DIAGNOSIS — M6281 Muscle weakness (generalized): Secondary | ICD-10-CM | POA: Diagnosis not present

## 2021-08-15 DIAGNOSIS — R278 Other lack of coordination: Secondary | ICD-10-CM

## 2021-08-15 NOTE — Therapy (Signed)
[place] at [facility] for Women [address] [place], [place], [address] Phone: [phone]   Fax:  [phone]  Physical Therapy Treatment  Patient Details  Name: Nicole Cantu MRN: [id number] Date of Birth: [birth year] Referring Provider (PT): Dr. Sherlene Shams   Encounter Date: 08/15/2021   PT End of Session - 08/15/21 1032     Visit Number 19    Date for PT Re-Evaluation 08/31/21    Authorization Time [place] - Visit Number 19    Authorization - Number of Visits 60    PT Start Time 5809    PT Stop Time 1115    PT Time Calculation (min) 45 min    Activity Tolerance Patient tolerated treatment well    Behavior During Therapy [facility] for tasks assessed/performed             Past Medical History:  Diagnosis Date   Anxiety    Arrhythmia    Holter 01/26/2008-HR [id number] rare PVC .pac No adverse arrhythmia   Arthritis    Back pain    Benign intracranial hypertension    Dr Jannifer Franklin    Chest pain    NUC stress - no ischemia   Colon polyps    Fatigue    Hyperlipidemia    Hypertension    Pulsatile tinnitus of both ears     Past Surgical History:  Procedure Laterality Date   COLONOSCOPY     WISDOM TOOTH EXTRACTION     about 15 years ago    There were no vitals filed for this visit.   Subjective Assessment - 08/15/21 1033     Subjective I did my exercises. I had less leakage with the pessary. I did the contraction in standing and felt the lift of the pelvic floor. I have good and bad days with my back.    Patient Stated Goals reduce leakage, strengthen to assist with prolapse                               [facility] Adult PT Treatment/Exercise - 08/15/21 0001       Self-Care   Self-Care Other Self-Care Comments    Other Self-Care Comments  educated pateint on sitting on a rolled up towel to take the pressure off the vagina when using her pelvic floor EMG      Lumbar  Exercises: Seated   Other Seated Lumbar Exercises seated marching with opposite arm holding 1# wt, red band around knees and yoga block between knees    Other Seated Lumbar Exercises seated antirotational movement with yoga block between knees and holding 1 # wt in each hand; seated bilatera hip and shoulder ER with red band around the knees and holding 1# in each hand                     PT Education - 08/15/21 1116     Education Details Access Code: [email or web address]    Person(s) Educated Patient    Methods Explanation;Demonstration;Verbal cues;Handout    Comprehension Returned demonstration;Verbalized understanding              PT Short Term Goals - 04/11/21 1137       PT SHORT TERM GOAL #1   Title independent with initial HEP for pelvic floor strengthening in supine    Time 4    Period Weeks    Status Achieved  Target Date 04/13/21      PT SHORT TERM GOAL #2   Title understands ways to manage prolapse during the day to reduce chance of prolapse from progressing    Time 4    Period Weeks    Status Achieved               PT Long Term Goals - 08/15/21 1121       PT LONG TERM GOAL #1   Title independent with advanced HEP for pelvic floor and core strength without aggravating her back or left shoulder    Time 12    Period Weeks    Status On-going    Target Date 06/08/21      PT LONG TERM GOAL #2   Title understand pressure management to reduce strain on the pelvic floor with lifting, daily tasks and gardening    Time 12    Period Weeks    Status Achieved    Target Date 06/08/21      PT LONG TERM GOAL #3   Title pelvic floor strength >/= 3/5 to reduce her urinary leakage >/= 75% during daily tasks    Baseline using the perfit, first time to fill the pelvic floor hug therapis finger and lift the pelvic floor in standing    Time 12    Period Weeks    Status On-going    Target Date 06/08/21      PT LONG TERM GOAL #4   Title wears 1 thin pad daily for  just in case due to reduction of daily leakage.    Time 12    Period Weeks    Status On-going    Target Date 06/08/21                   Plan - 08/15/21 1041     Clinical Impression Statement Patient reports she is leaking less with the pessary in. She feels the pelvic floor contraction with a lift in standing for first time. Patient needs verbal cues and reptition with her exercises to coordinate corre tly, breath at the right time and engage the abdominals and pelvic floor at same time. Patients exercises were advanced in sitting today. Patient will benefit from skilled therapy to improve urinary leakage  with pessary in.    Personal Factors and Comorbidities Fitness;Past/Current Experience    Examination-Activity Limitations Continence;Lift;Carry;Bend    Examination-Participation Restrictions Community Activity    Stability/Clinical Decision Making Stable/Uncomplicated    Rehab Potential Excellent    PT Frequency 1x / week    PT Duration 12 weeks    PT Treatment/Interventions ADLs/Self Care Home Management;Biofeedback;Therapeutic activities;Therapeutic exercise;Neuromuscular re-education;Patient/family education;Manual techniques;Dry needling    PT Next Visit Plan continues to progress the sitting exercises and add easy standing to engage the pelvic floor.    PT Home Exercise Plan Access Code: [email or web address]    Consulted and Agree with Plan of Care Patient             Patient will benefit from skilled therapeutic intervention in order to improve the following deficits and impairments:  Decreased endurance, Decreased activity tolerance, Decreased strength, Increased fascial restricitons, Decreased coordination  Visit Diagnosis: Muscle weakness (generalized)  Other lack of coordination     Problem List There are no problems to display for this patient.   Earlie Counts, PT 08/15/21 11:22 AM  [facility] at [facility] for [facility] [address]  [place], [place], [address] Phone: [phone]   Fax:  [phone]  Name: Nicole Cantu MRN: [id number] Date of Birth: [birth year]

## 2021-08-15 NOTE — Patient Instructions (Signed)
Access Code: [email or web address] URL: [email or web address] Date: 08/15/2021 Prepared by: Earlie Counts  Program Notes lay on back under hips, pillows under knees like a butterfly for 10 minutes.   Exercises -- Standing Anti-Rotation Press with Anchored Resistance  - 1 x daily - 3 x weekly - 2 sets - 10 reps - Seated March with Same Side Arm Raise   - 1 x daily - 7 x weekly - 2 sets - 10 reps - Seated Shoulder External Rotation  - 1 x daily - 7 x weekly - 3 sets - 10 reps Earlie Counts, PT [facility] [address], [place], [place] [address] W: [phone] [email or web address]

## 2021-08-16 ENCOUNTER — Ambulatory Visit: Payer: 59 | Admitting: Obstetrics and Gynecology

## 2021-08-22 ENCOUNTER — Encounter: Payer: 59 | Admitting: Physical Therapy

## 2021-08-22 ENCOUNTER — Encounter: Payer: Self-pay | Admitting: Physical Therapy

## 2021-08-22 DIAGNOSIS — R278 Other lack of coordination: Secondary | ICD-10-CM

## 2021-08-22 DIAGNOSIS — M6281 Muscle weakness (generalized): Secondary | ICD-10-CM

## 2021-09-12 ENCOUNTER — Encounter: Payer: Self-pay | Admitting: Physical Therapy

## 2021-09-12 ENCOUNTER — Encounter: Payer: 59 | Attending: Obstetrics and Gynecology | Admitting: Physical Therapy

## 2021-09-12 DIAGNOSIS — R278 Other lack of coordination: Secondary | ICD-10-CM | POA: Diagnosis present

## 2021-09-12 DIAGNOSIS — M6281 Muscle weakness (generalized): Secondary | ICD-10-CM | POA: Diagnosis present

## 2021-09-12 DIAGNOSIS — N393 Stress incontinence (female) (male): Secondary | ICD-10-CM | POA: Insufficient documentation

## 2021-09-12 NOTE — Therapy (Signed)
[place] at [facility] for Women [address], [place], [place], [address] Phone: [phone]   Fax:  [phone]  Physical Therapy Treatment  Patient Details  Name: Nicole Cantu MRN: [id number] Date of Birth: May 30, 1955 Referring Provider (PT): Dr. Sherlene Shams   Encounter Date: 09/12/2021   PT End of Session - 09/12/21 1131     Visit Number 21    Date for PT Re-Evaluation 01/01/22    Authorization Time [place]    Authorization - Visit Number 21    Authorization - Number of Visits 60    PT Start Time 1130    PT Stop Time 1215    PT Time Calculation (min) 45 min    Activity Tolerance Patient tolerated treatment well    Behavior During Therapy [facility] for tasks assessed/performed             Past Medical History:  Diagnosis Date   Anxiety    Arrhythmia    Holter 01/26/2008-HR [id number] rare PVC .pac No adverse arrhythmia   Arthritis    Back pain    Benign intracranial hypertension    Dr Jannifer Franklin    Chest pain    NUC stress - no ischemia   Colon polyps    Fatigue    Hyperlipidemia    Hypertension    Pulsatile tinnitus of both ears     Past Surgical History:  Procedure Laterality Date   COLONOSCOPY     WISDOM TOOTH EXTRACTION     about 15 years ago    There were no vitals filed for this visit.   Subjective Assessment - 09/12/21 1131     Subjective In standing my strength is registering  but in the past it was not. My husband feel like the pelvic floor is stronger. When she wears the ring with support there is a dribble of urine and when I stop it there will be more. When I wake up my spine is crooked and I have a fear of leaking urine when I get up.    Patient Stated Goals reduce leakage, strengthen to assist with prolapse    Currently in Pain? No/denies                               [facility] Adult PT Treatment/Exercise - 09/12/21 0001       Self-Care   Self-Care Other Self-Care  Comments    Other Self-Care Comments  use the pessary with the knob to massage the left pelvic floor due to tightness.      Therapeutic Activites    Therapeutic Activities Other Therapeutic Activities    Other Therapeutic Activities educated patient with laying on her side with pillows between her knees and towel roll on the waistline.; sitting with equal weight on both buttocks to reduce the tilt of the pelvis;      Knee/Hip Exercises: Standing   Hip Flexion Stengthening;Right;Left;1 set;10 reps    Hip Flexion Limitations contraction of the pelvic floor and abdominal contraction    Other Standing Knee Exercises standing hip circles with core engaged and pelvic floor; standing hip 2 ways with pelvic floor and abdominal contraction;    Other Standing Knee Exercises standing alterante shoulder flexion; standing with abdominal and pelvic floor contraction.                     PT Education - 09/12/21 1214     Education  Details Access Code: [email or web address]    Person(s) Educated Patient    Methods Explanation;Demonstration;Verbal cues;Handout    Comprehension Returned demonstration;Verbalized understanding              PT Short Term Goals - 08/22/21 1049       PT SHORT TERM GOAL #1   Title independent with initial HEP for pelvic floor strengthening in supine    Time 4    Period Weeks    Status Achieved    Target Date 04/13/21      PT SHORT TERM GOAL #2   Title understands ways to manage prolapse during the day to reduce chance of prolapse from progressing    Time 4    Period Weeks    Status Achieved    Target Date 04/13/21               PT Long Term Goals - 08/22/21 1050       PT LONG TERM GOAL #1   Title independent with advanced HEP for pelvic floor and core strength without aggravating her back or left shoulder    Time 12    Period Weeks    Status On-going      PT LONG TERM GOAL #2   Time 12    Period Weeks    Status Achieved      PT LONG TERM GOAL  #3   Title pelvic floor strength >/= 3/5 to reduce her urinary leakage >/= 75% during daily tasks    Baseline using the perfit, first time to fill the pelvic floor hug therapis finger and lift the pelvic floor in standing    Time 12    Period Weeks    Status On-going    Target Date 06/08/21      PT LONG TERM GOAL #4   Title wears 1 thin pad daily for just in case due to reduction of daily leakage.    Time 12    Period Weeks    Status On-going    Target Date 06/08/21                   Plan - 09/12/21 1215     Clinical Impression Statement Patient is able to contract 43 grams in standing using her perifit compared to 0 when she first started. She is able to engage her abdominals with pelvic floor in standing much better. She has learned standing exercises she can do in the pool. Patient has noticed her left side of the pelvic lfoor is tightt when she uses the pessary with a knob so she will use the knob to massage the wall. Patient will leak a trickle with the pessary compared to a gush. Patient will benefit from skilled therapy to improve pelvic floor strength  to reduce her leakage.    Personal Factors and Comorbidities Fitness;Past/Current Experience    Examination-Activity Limitations Continence;Lift;Carry;Bend    Examination-Participation Restrictions Community Activity    Stability/Clinical Decision Making Stable/Uncomplicated    Rehab Potential Excellent    PT Frequency [place]    PT Duration Other (comment)   4 months   PT Treatment/Interventions ADLs/Self Care Home Management;Biofeedback;Therapeutic activities;Therapeutic exercise;Neuromuscular re-education;Patient/family education;Manual techniques;Dry needling    PT Next Visit Plan continues to progress the sitting exercises and add easy standing to engage the pelvic floor. See patient monthly.    PT Home Exercise Plan Access Code: [email or web address]    Consulted and Agree with Plan of Care Patient  Patient  will benefit from skilled therapeutic intervention in order to improve the following deficits and impairments:  Decreased endurance, Decreased activity tolerance, Decreased strength, Increased fascial restricitons, Decreased coordination  Visit Diagnosis: Muscle weakness (generalized)  Other lack of coordination     Problem List There are no problems to display for this patient.   Earlie Counts, PT 09/12/21 12:21 PM  [place] [facility] at [facility] for Women [address], [place], [place], [address] Phone: [phone]   Fax:  [phone]  Name: Nicole Cantu MRN: [id number] Date of Birth: [birth year]

## 2021-09-12 NOTE — Patient Instructions (Signed)
Access Code: [email or web address] URL: [email or web address] Date: 09/12/2021 Prepared by: Earlie Counts  Exercises -  - Standing Hip Circles at [place] 1 x daily - 3 x weekly - 2 sets - 10 reps - Standing March at [place] 1 x daily - 3 x weekly - 2 sets - 10 reps - Standing 3-Way Kick with Ankle Float at [place] 1 x daily - 3 x weekly - 2 sets - 10 reps - Asymmetrical Shoulder Flexion Extension AROM at [employer]  - 1 x daily - 3 x weekly - 2 sets - 10 reps Earlie Counts, PT [facility] [address], [place],  [address] W: [phone] [email or web address]

## 2021-09-20 ENCOUNTER — Ambulatory Visit: Payer: 59 | Admitting: Obstetrics and Gynecology

## 2021-10-03 ENCOUNTER — Encounter: Payer: Self-pay | Admitting: Physical Therapy

## 2021-10-03 ENCOUNTER — Encounter: Payer: 59 | Attending: Obstetrics and Gynecology | Admitting: Physical Therapy

## 2021-10-03 DIAGNOSIS — R278 Other lack of coordination: Secondary | ICD-10-CM | POA: Insufficient documentation

## 2021-10-03 DIAGNOSIS — M6281 Muscle weakness (generalized): Secondary | ICD-10-CM | POA: Insufficient documentation

## 2021-10-03 NOTE — Therapy (Signed)
[place] at [facility] for [facility] [address], [place], [place], [address] Phone: [phone]   Fax:  [phone]  Physical Therapy Treatment  Patient Details  Name: Nicole Cantu MRN: [id number] Date of Birth: 1955-03-10 Referring Provider (PT): Dr. Sherlene Shams   Encounter Date: 10/03/2021   PT End of Session - 10/03/21 1129     Visit Number 22    Date for PT Re-Evaluation 01/01/22    Authorization Time [place] - Visit Number 22    Authorization - Number of Visits 60    PT Start Time 4742    PT Stop Time 1115    PT Time Calculation (min) 45 min    Activity Tolerance Patient tolerated treatment well    Behavior During Therapy [facility] for tasks assessed/performed             Past Medical History:  Diagnosis Date   Anxiety    Arrhythmia    Holter 01/26/2008-HR [id number] rare PVC .pac No adverse arrhythmia   Arthritis    Back pain    Benign intracranial hypertension    Dr Jannifer Franklin    Chest pain    NUC stress - no ischemia   Colon polyps    Fatigue    Hyperlipidemia    Hypertension    Pulsatile tinnitus of both ears     Past Surgical History:  Procedure Laterality Date   COLONOSCOPY     WISDOM TOOTH EXTRACTION     about 15 years ago    There were no vitals filed for this visit.   Subjective Assessment - 10/03/21 1039     Subjective I am focusing on the small victories. I am leaking less with the pessary in.    Patient Stated Goals reduce leakage, strengthen to assist with prolapse                               [facility] Adult PT Treatment/Exercise - 10/03/21 0001       Knee/Hip Exercises: Standing   Forward Lunges Right;Left;1 set;5 reps    Hip Extension Right;Left;1 set;10 reps    Extension Limitations plank against wall    Functional Squat 1 set;5 reps    Rocker Board Limitations stand on one leg with hands on counter and lean forward 5x each side    Other  Standing Knee Exercises side plank against the wall raising the arm and leg holding ofr 5 sec. forward plank holding for 10 sec    Other Standing Knee Exercises stand with hands on counter and lift leg deeping spinal neutral 10x each side.; standng with cat cow with holding on the wall.                     PT Education - 10/03/21 1125     Education Details [email or web address]- exercises to do in the pool    Person(s) Educated Patient    Methods Explanation;Demonstration;Verbal cues;Handout    Comprehension Verbalized understanding;Returned demonstration              PT Short Term Goals - 08/22/21 1049       PT SHORT TERM GOAL #1   Title independent with initial HEP for pelvic floor strengthening in supine    Time 4    Period Weeks    Status Achieved    Target Date 04/13/21      PT SHORT TERM GOAL #  2   Title understands ways to manage prolapse during the day to reduce chance of prolapse from progressing    Time 4    Period Weeks    Status Achieved    Target Date 04/13/21               PT Long Term Goals - 08/22/21 1050       PT LONG TERM GOAL #1   Title independent with advanced HEP for pelvic floor and core strength without aggravating her back or left shoulder    Time 12    Period Weeks    Status On-going      PT LONG TERM GOAL #2   Time 12    Period Weeks    Status Achieved      PT LONG TERM GOAL #3   Title pelvic floor strength >/= 3/5 to reduce her urinary leakage >/= 75% during daily tasks    Baseline using the perfit, first time to fill the pelvic floor hug therapis finger and lift the pelvic floor in standing    Time 12    Period Weeks    Status On-going    Target Date 06/08/21      PT LONG TERM GOAL #4   Title wears 1 thin pad daily for just in case due to reduction of daily leakage.    Time 12    Period Weeks    Status On-going    Target Date 06/08/21                   Plan - 10/03/21 1125     Clinical Impression Statement  Patient is leaking less iwth pessary in. She will stand crooked in the morning for 2 hours instead of 3 due to getting stronger. She has increased endurance and coordination when using the perifit but her strength is slowly improving. Patient has learned new core exercises to strengthen the pelvic floor in the pool and core to decrease strain on her back. Patient will benefit from skilled therapy to improve pelvic floor strength and reduce her leakage.    Personal Factors and Comorbidities Fitness;Past/Current Experience    Examination-Activity Limitations Continence;Lift;Carry;Bend    Examination-Participation Restrictions Community Activity    Stability/Clinical Decision Making Stable/Uncomplicated    Rehab Potential Excellent    PT Frequency [place]    PT Duration Other (comment)   4 months   PT Treatment/Interventions ADLs/Self Care Home Management;Biofeedback;Therapeutic activities;Therapeutic exercise;Neuromuscular re-education;Patient/family education;Manual techniques;Dry needling    PT Next Visit Plan continues to progress the sitting exercises and add easy standing to engage the pelvic floor. See patient monthly.    PT Home Exercise Plan Access Code: [email or web address]    Recommended Other Services MD signed all notes    Consulted and Agree with Plan of Care Patient             Patient will benefit from skilled therapeutic intervention in order to improve the following deficits and impairments:  Decreased endurance, Decreased activity tolerance, Decreased strength, Increased fascial restricitons, Decreased coordination  Visit Diagnosis: Muscle weakness (generalized)  Other lack of coordination     Problem List There are no problems to display for this patient.   Earlie Counts, PT 10/03/21 11:30 AM  [place] at [facility] for [facility] [address] [place], [place], [address] Phone: [phone]   Fax:  [phone]  Name: Nicole Cantu MRN:  [id number] Date of Birth: 1955/10/01

## 2021-11-14 ENCOUNTER — Encounter: Payer: 59 | Attending: Obstetrics and Gynecology | Admitting: Physical Therapy

## 2021-11-14 ENCOUNTER — Encounter: Payer: Self-pay | Admitting: Physical Therapy

## 2021-11-14 DIAGNOSIS — R278 Other lack of coordination: Secondary | ICD-10-CM | POA: Insufficient documentation

## 2021-11-14 DIAGNOSIS — M6281 Muscle weakness (generalized): Secondary | ICD-10-CM | POA: Diagnosis present

## 2021-11-14 NOTE — Therapy (Signed)
[place] at [facility] [address] [place], [place], [address] Phone: [phone]   Fax:  [phone]  Physical Therapy Treatment  Patient Details  Name: Nicole Cantu MRN: [id number] Date of Birth: 1955-03-09 Referring Provider (PT): Dr. Sherlene Shams   Encounter Date: 11/14/2021   PT End of Session - 11/14/21 1035     Visit Number 23    Date for PT Re-Evaluation 01/01/22    Authorization Time [place] - Visit Number 23    Authorization - Number of Visits 60    PT Start Time 6283    PT Stop Time 1115    PT Time Calculation (min) 45 min    Activity Tolerance Patient tolerated treatment well    Behavior During Therapy [facility] for tasks assessed/performed             Past Medical History:  Diagnosis Date   Anxiety    Arrhythmia    Holter 01/26/2008-HR [id number] rare PVC .pac No adverse arrhythmia   Arthritis    Back pain    Benign intracranial hypertension    Dr Jannifer Franklin    Chest pain    NUC stress - no ischemia   Colon polyps    Fatigue    Hyperlipidemia    Hypertension    Pulsatile tinnitus of both ears     Past Surgical History:  Procedure Laterality Date   COLONOSCOPY     WISDOM TOOTH EXTRACTION     about 15 years ago    There were no vitals filed for this visit.   Subjective Assessment - 11/14/21 1036     Subjective I am using the pessary less. When use the pessary there is a small dribble. I will use the poise pad when I am bending over many times.    Patient Stated Goals reduce leakage, strengthen to assist with prolapse    Currently in Pain? No/denies                [facility] PT Assessment - 11/14/21 0001       Assessment   Medical Diagnosis N39.3 SUI    Referring Provider (PT) Dr. Sherlene Shams    Onset Date/Surgical Date --   2.5 years ago   Prior Therapy none      Precautions   Precautions None      Restrictions   Weight Bearing Restrictions No       Home Environment   Living Environment Private residence      Prior Function   Vocation Retired    Leisure swimming 30 minutes, back exercises, gardening      Cognition   Overall Cognitive Status Within Functional Limits for tasks assessed      AROM   Overall AROM Comments lumbar ROM is limited by 25% except for flexion is full      PROM   Right Hip External Rotation  65    Right Hip Internal Rotation  20    Left Hip External Rotation  55    Left Hip Internal Rotation  20      Strength   Overall Strength Comments SLR without pelvic movement    Right Hip ABduction 4+/5    Left Hip ABduction 5/5                        Pelvic Floor Special Questions - 11/14/21 0001     Strength good squeeze, good lift, able  to hold agaisnt strong resistance   trouble relaxing              [facility] Adult PT Treatment/Exercise - 11/14/21 0001       Self-Care   Self-Care Other Self-Care Comments    Other Self-Care Comments  how to lay on stomach to reduce prolapse.Reviewed the information from her perfit exercise program and looked aat her progress                       PT Short Term Goals - 11/14/21 1123       PT SHORT TERM GOAL #1   Title independent with initial HEP for pelvic floor strengthening in supine    Time 4    Period Weeks    Status Achieved    Target Date 04/13/21      PT SHORT TERM GOAL #2   Title understands ways to manage prolapse during the day to reduce chance of prolapse from progressing    Time 4    Period Weeks    Status Achieved    Target Date 04/13/21               PT Long Term Goals - 11/14/21 1123       PT LONG TERM GOAL #1   Title independent with advanced HEP for pelvic floor and core strength without aggravating her back or left shoulder    Time 12    Period Weeks    Status Achieved    Target Date 06/08/21      PT LONG TERM GOAL #2   Title understand pressure management to reduce strain on the pelvic  floor with lifting, daily tasks and gardening    Time 12    Period Weeks    Status Achieved    Target Date 06/08/21      PT LONG TERM GOAL #3   Title pelvic floor strength >/= 3/5 to reduce her urinary leakage >/= 75% during daily tasks    Baseline no leakage with pessary. She will have some with the pessary in.    Time 12    Period Weeks    Status Achieved      PT LONG TERM GOAL #4   Title wears 1 thin pad daily for just in case due to reduction of daily leakage.    Baseline wears a poise with pessary    Time 12    Period Weeks    Status Partially Met                   Plan - 11/14/21 1125     Clinical Impression Statement Patient does not leak when she is not wearing the pessary. She has less trickling of urine when using the pessary. She may feel the bulge when she sits down. She is using the perfit device for strengthening and it shows she has overall improvement. She still needs improvement with quick contractions. She is having less back pain with her exericses. She is consisitent with her exercises. Patient is ready for discharge to HEP.    Personal Factors and Comorbidities Fitness;Past/Current Experience    Examination-Activity Limitations Continence;Lift;Carry;Bend    Examination-Participation Restrictions Community Activity    Stability/Clinical Decision Making Stable/Uncomplicated    Rehab Potential Excellent    PT Treatment/Interventions ADLs/Self Care Home Management;Biofeedback;Therapeutic activities;Therapeutic exercise;Neuromuscular re-education;Patient/family education;Manual techniques;Dry needling    PT Next Visit Plan Discharge to HEP    Consulted and Agree with Plan of  Care Patient             Patient will benefit from skilled therapeutic intervention in order to improve the following deficits and impairments:  Decreased endurance, Decreased activity tolerance, Decreased strength, Increased fascial restricitons, Decreased coordination  Visit  Diagnosis: Muscle weakness (generalized)  Other lack of coordination     Problem List There are no problems to display for this patient.   Earlie Counts, PT 11/14/21 11:29 AM  [facility] at [facility] [address], [place], [place], [address] Phone: [phone]   Fax:  [phone]  Name: Jannely Henthorn MRN: [id number] Date of Birth: 1955/12/26  PHYSICAL THERAPY DISCHARGE SUMMARY  Visits from Start of Care: 1  Current functional level related to goals / functional outcomes: See above.    Remaining deficits: See above.    Education / Equipment: HEP   Patient agrees to discharge. Patient goals were met. Patient is being discharged due to meeting the stated rehab goals. Thank you for the referral. Earlie Counts, PT 11/14/21 11:29 AM

## 2021-11-17 ENCOUNTER — Encounter: Payer: Self-pay | Admitting: Obstetrics and Gynecology

## 2021-11-17 ENCOUNTER — Ambulatory Visit (INDEPENDENT_AMBULATORY_CARE_PROVIDER_SITE_OTHER): Payer: 59 | Admitting: Obstetrics and Gynecology

## 2021-11-17 VITALS — BP 132/74 | HR 80

## 2021-11-17 DIAGNOSIS — N393 Stress incontinence (female) (male): Secondary | ICD-10-CM

## 2021-11-17 DIAGNOSIS — N812 Incomplete uterovaginal prolapse: Secondary | ICD-10-CM | POA: Diagnosis not present

## 2021-11-17 DIAGNOSIS — N8110 Cystocele, unspecified: Secondary | ICD-10-CM | POA: Diagnosis not present

## 2021-11-17 NOTE — Progress Notes (Signed)
[facility]   Subjective:     Chief Complaint:  Chief Complaint  Patient presents with   Follow-up    Nicole Cantu is a 66 y.o. female here for a f/u from completing PT   History of Present Illness: Nicole Cantu is a 66 y.o. female with stage II pelvic organ prolapse and stress incontinence who presents for a pessary check. She is using a ring with support or size #4 shaatz pessary.  Has small leakage with the pessary in the place. Only using the pessary when she plans to be active. Has completed pelvic PT. Having some pressure in her lower abdomen/ pelvis.   She is not taking medication for the bladder, she does not have any bladder urgency. She is using the estrace cream twice a week.   Has been using the perifit biofeedback device and strength has improved  Past Medical History: Patient  has a past medical history of Anxiety, Arrhythmia, Arthritis, Back pain, Benign intracranial hypertension, Chest pain, Colon polyps, Fatigue, Hyperlipidemia, Hypertension, and Pulsatile tinnitus of both ears.   Past Surgical History: She  has a past surgical history that includes Colonoscopy and Wisdom tooth extraction.   Medications: She has a current medication list which includes the following prescription(s): estradiol, gabapentin, lisinopril-hydrochlorothiazide, and simvastatin.   Allergies: Patient has No Known Allergies.   Social History: Patient  reports that she has never smoked. She has never used smokeless tobacco. She reports current alcohol use of about 1.0 standard drink of alcohol per week. She reports that she does not use drugs.      Objective:    Physical Exam: BP 132/74   Pulse 80  Gen: No apparent distress, A&O x 3. Detailed Urogynecologic Evaluation:  Normal external genitalia. Normal vaginal mucosa, normal appearing cervix.  POP-Q  -1                                            Aa   -1                                           Ba  -4.5                                               C   3.5                                            Gh  4                                            Pb  8                                            tvl   -2  Ap  -2                                            Bp  -7                                              D     Assessment/Plan:    Assessment: Nicole Cantu is a 66 y.o. with stage II pelvic organ prolapse , SUI   Plan: - Continue pessary as needed for now - We reviewed surgical options for prolapse:    1) vaginal repair without mesh - Pros - safer, no mesh complications - Cons - not as strong as mesh repair, higher risk of recurrence  2) laparoscopic repair with mesh - Pros - stronger, better long-term success - Cons - risks of mesh implant (erosion into vagina or bladder, adhering to the rectum, pain) - these risks are lower than with a vaginal mesh but still exist - Handouts provided on both options - Alternatively, if she wants to continue using the pessary, can consider urethral bulking to help with the incontinence without prolapse repair.   She will notify us of how she wants to proceed.   Jaquita Folds, MD    Time spent: I spent 35 minutes dedicated to the care of this patient on the date of this encounter to include pre-visit review of records, face-to-face time with the patient discussing surgical options and post visit documentation.

## 2021-12-18 ENCOUNTER — Encounter: Payer: Self-pay | Admitting: *Deleted

## 2022-07-20 ENCOUNTER — Other Ambulatory Visit: Payer: Self-pay

## 2022-07-20 ENCOUNTER — Encounter (HOSPITAL_COMMUNITY): Payer: Self-pay | Admitting: Emergency Medicine

## 2022-07-20 ENCOUNTER — Ambulatory Visit (HOSPITAL_COMMUNITY)
Admission: EM | Admit: 2022-07-20 | Discharge: 2022-07-20 | Disposition: A | Discharge status: Home / Self Care | Payer: 59 | Attending: Internal Medicine | Admitting: Internal Medicine

## 2022-07-20 DIAGNOSIS — W57XXXA Bitten or stung by nonvenomous insect and other nonvenomous arthropods, initial encounter: Secondary | ICD-10-CM | POA: Diagnosis not present

## 2022-07-20 DIAGNOSIS — R21 Rash and other nonspecific skin eruption: Secondary | ICD-10-CM | POA: Diagnosis not present

## 2022-07-20 DIAGNOSIS — S70262A Insect bite (nonvenomous), left hip, initial encounter: Secondary | ICD-10-CM | POA: Diagnosis not present

## 2022-07-20 MED ORDER — DOXYCYCLINE HYCLATE 100 MG PO CAPS: 100.0000 mg | ORAL_CAPSULE | Freq: Two times a day (BID) | ORAL | 0 refills | Status: AC

## 2022-07-20 NOTE — ED Triage Notes (Signed)
Pt here for bullseye rash to left buttocks.  Has been bit by tick in this area; pt unsure exactly when as reports many tick bits but thinks within last month for sure.   No fever.  Has started with left knee pain couple weeks ago; has had arthritic knee pain here which has been gone for two years until now so unsure if related.

## 2022-07-20 NOTE — ED Provider Notes (Signed)
[facility]    CSN: [id number] Arrival date & time: 07/20/22  1757      History   Chief Complaint Chief Complaint  Patient presents with   Rash    HPI Nicole Cantu is a 67 y.o. female.   Patient presents to urgent care for evaluation of target lesion to the anterior left hip that she first noticed when she was going to take a shower yesterday.  She states she gardens frequently and has already suffered from approximately 20 tick bites this season.  She states she has recently had approximately 3 tick bites to the left hip and she has been able to pull all of the ticks off in full.  She wonders if the lesion is due to a tick bite or due to recent power washing where the hose rubbed her left hip.  The area of concern does not itch, is not swollen, and is not painful.  No obvious bite lesions to the center of the area.  No recent known sick contacts with similar symptoms/rash.  She has not had any fever, chills, body aches, N/V/D, abdominal pain, headache, or sore throat.  She has not used any over-the-counter medications before coming to urgent care to help with symptoms and denies recent antibiotic/steroid use.  Denies history of diabetes and immunosuppression.     Past Medical History:  Diagnosis Date   Anxiety    Arrhythmia    Holter 01/26/2008-HR [phone] rare PVC .pac No adverse arrhythmia   Arthritis    Back pain    Benign intracranial hypertension    Dr Anne Hahn    Chest pain    NUC stress - no ischemia   Colon polyps    Fatigue    Hyperlipidemia    Hypertension    Pulsatile tinnitus of both ears     There are no problems to display for this patient.   Past Surgical History:  Procedure Laterality Date   COLONOSCOPY     WISDOM TOOTH EXTRACTION     about 15 years ago    OB History     Gravida  0   Para  0   Term  0   Preterm  0   AB  0   Living  0      SAB  0   IAB  0   Ectopic  0   Multiple  0   Live Births  0             Home Medications    Prior to Admission medications   Medication Sig Start Date End Date Taking? Authorizing Provider  doxycycline (VIBRAMYCIN) 100 MG capsule Take 1 capsule (100 mg total) by mouth 2 (two) times daily for 7 days. 07/20/22 07/27/22 Yes StanhopeDonavan Burnet, FNP  estradiol (ESTRACE) 0.1 MG/GM vaginal cream Place 0.5 g vaginally 2 (two) times a week. Place 0.5g nightly for two weeks then twice a week after 02/02/21  Yes Marguerita Beards, MD  lisinopril-hydrochlorothiazide (PRINZIDE,ZESTORETIC) 10-12.5 MG per tablet Take 1 tablet by mouth daily.   Yes [provider]  simvastatin (ZOCOR) 40 MG tablet Take 40 mg by mouth daily.   Yes [provider]  gabapentin (NEURONTIN) 400 MG capsule Take 400 mg by mouth 3 (three) times daily. Patient not taking: Reported on 07/20/2022    [provider]    Family History Family History  Problem Relation Age of Onset   Breast cancer Mother    Colon  cancer Neg Hx    Esophageal cancer Neg Hx    Liver cancer Neg Hx    Pancreatic cancer Neg Hx    Rectal cancer Neg Hx    Stomach cancer Neg Hx     Social History Social History   Tobacco Use   Smoking status: Never   Smokeless tobacco: Never  Vaping Use   Vaping Use: Never used  Substance Use Topics   Alcohol use: Yes    Alcohol/week: 1.0 standard drink of alcohol    Types: 1 Cans of beer per week    Comment: Socially   Drug use: Never     Allergies   Patient has no known allergies.   Review of Systems Review of Systems Per HPI  Physical Exam Triage Vital Signs ED Triage Vitals  Enc Vitals Group     BP 07/20/22 1811 (!) 147/83     Pulse Rate 07/20/22 1811 85     Resp 07/20/22 1811 16     Temp 07/20/22 1811 98.3 F (36.8 C)     Temp Source 07/20/22 1811 Oral     SpO2 07/20/22 1811 96 %     Weight 07/20/22 1812 160 lb (72.6 kg)     Height 07/20/22 1812 5\' 4"  (1.626 m)     Head Circumference --      Peak Flow --      Pain  Score 07/20/22 1812 0     Pain Loc --      Pain Edu? --      Excl. in GC? --    No data found.  Updated Vital Signs BP (!) 147/83   Pulse 85   Temp 98.3 F (36.8 C) (Oral)   Resp 16   Ht 5\' 4"  (1.626 m)   Wt 160 lb (72.6 kg)   SpO2 96%   BMI 27.46 kg/m   Visual Acuity Right Eye Distance:   Left Eye Distance:   Bilateral Distance:    Right Eye Near:   Left Eye Near:    Bilateral Near:     Physical Exam Vitals and nursing note reviewed.  Constitutional:      Appearance: She is not ill-appearing or toxic-appearing.  HENT:     Head: Normocephalic and atraumatic.     Right Ear: Hearing and external ear normal.     Left Ear: Hearing and external ear normal.     Nose: Nose normal.     Mouth/Throat:     Lips: Pink.     Mouth: Mucous membranes are moist. No injury.     Tongue: No lesions. Tongue does not deviate from midline.     Palate: No mass and lesions.     Pharynx: Oropharynx is clear. Uvula midline. No pharyngeal swelling, oropharyngeal exudate, posterior oropharyngeal erythema or uvula swelling.     Tonsils: No tonsillar exudate or tonsillar abscesses.  Eyes:     General: Lids are normal. Vision grossly intact. Gaze aligned appropriately.     Extraocular Movements: Extraocular movements intact.     Conjunctiva/sclera: Conjunctivae normal.  Cardiovascular:     Rate and Rhythm: Normal rate and regular rhythm.     Heart sounds: Normal heart sounds, S1 normal and S2 normal.  Pulmonary:     Effort: Pulmonary effort is normal. No respiratory distress.     Breath sounds: Normal breath sounds and air entry.  Musculoskeletal:     Cervical back: Neck supple.  Skin:    General: Skin is warm and dry.  Capillary Refill: Capillary refill takes less than 2 seconds.     Findings: Rash (Target lesion to the left anterior hip as seen in image below.) present.  Neurological:     General: No focal deficit present.     Mental Status: She is alert and oriented to person,  place, and time. Mental status is at baseline.     Cranial Nerves: No dysarthria or facial asymmetry.  Psychiatric:        Mood and Affect: Mood normal.        Speech: Speech normal.        Behavior: Behavior normal.        Thought Content: Thought content normal.        Judgment: Judgment normal.      UC Treatments / Results  Labs (all labs ordered are listed, but only abnormal results are displayed) Labs Reviewed - No data to display  EKG   Radiology No results found.  Procedures Procedures (including critical care time)  Medications Ordered in UC Medications - No data to display  Initial Impression / Assessment and Plan / UC Course  I have reviewed the triage vital signs and the nursing notes.  Pertinent labs & imaging results that were available during my care of the patient were reviewed by me and considered in my medical decision making (see chart for details).   1.  Rash and nonspecific skin eruption, tick bite of left hip Rash appears to be consistent with target lesion associated with tick bite.  Doxycycline twice daily for 7 days sent to pharmacy to be taken as prescribed.  Discussed photosensitivity side effect and encouraged patient to wear extra sunscreen/be mindful of her time in the sun while taking doxycycline.  May use over-the-counter probiotic should she experience any diarrhea while taking this antibiotic.  States she eats yogurt regularly.  Advised to return to urgent care if she develops any new or worsening symptoms or signs of infection.  No current systemic symptoms.   Discussed physical exam and available lab work findings in clinic with patient.  Counseled patient regarding appropriate use of medications and potential side effects for all medications recommended or prescribed today. Discussed red flag signs and symptoms of worsening condition,when to call the PCP office, return to urgent care, and when to seek higher level of care in the emergency  department. Patient verbalizes understanding and agreement with plan. All questions answered. Patient discharged in stable condition.    Final Clinical Impressions(s) / UC Diagnoses   Final diagnoses:  Rash and nonspecific skin eruption  Tick bite of left hip, initial encounter     Discharge Instructions      Take doxycycline twice daily for the next 7 days. Wear sunscreen to protect your skin while taking doxycycline because of photosensitivity side effect. Return to urgent care if symptoms fail to improve in the next 2 to 3 days.     ED Prescriptions     Medication Sig Dispense Auth. Provider   doxycycline (VIBRAMYCIN) 100 MG capsule Take 1 capsule (100 mg total) by mouth 2 (two) times daily for 7 days. 14 capsule Carlisle Beers, FNP      PDMP not reviewed this encounter.   Carlisle Beers, [place] 07/20/22 [id number]

## 2022-07-20 NOTE — Discharge Instructions (Addendum)
Take doxycycline twice daily for the next 7 days. Wear sunscreen to protect your skin while taking doxycycline because of photosensitivity side effect. Return to urgent care if symptoms fail to improve in the next 2 to 3 days.

## 2022-10-31 DIAGNOSIS — H5213 Myopia, bilateral: Secondary | ICD-10-CM | POA: Diagnosis not present

## 2022-10-31 DIAGNOSIS — H2513 Age-related nuclear cataract, bilateral: Secondary | ICD-10-CM | POA: Diagnosis not present

## 2022-10-31 DIAGNOSIS — H04123 Dry eye syndrome of bilateral lacrimal glands: Secondary | ICD-10-CM | POA: Diagnosis not present

## 2022-11-19 DIAGNOSIS — R7303 Prediabetes: Secondary | ICD-10-CM | POA: Diagnosis not present

## 2022-11-19 DIAGNOSIS — I10 Essential (primary) hypertension: Secondary | ICD-10-CM | POA: Diagnosis not present

## 2022-11-19 DIAGNOSIS — E785 Hyperlipidemia, unspecified: Secondary | ICD-10-CM | POA: Diagnosis not present

## 2022-11-19 DIAGNOSIS — Z1159 Encounter for screening for other viral diseases: Secondary | ICD-10-CM | POA: Diagnosis not present

## 2022-12-13 DIAGNOSIS — Z0100 Encounter for examination of eyes and vision without abnormal findings: Secondary | ICD-10-CM | POA: Diagnosis not present

## 2022-12-18 DIAGNOSIS — Z1231 Encounter for screening mammogram for malignant neoplasm of breast: Secondary | ICD-10-CM | POA: Diagnosis not present

## 2023-01-08 DIAGNOSIS — N958 Other specified menopausal and perimenopausal disorders: Secondary | ICD-10-CM | POA: Diagnosis not present

## 2023-01-08 DIAGNOSIS — E2839 Other primary ovarian failure: Secondary | ICD-10-CM | POA: Diagnosis not present

## 2023-01-08 DIAGNOSIS — M8588 Other specified disorders of bone density and structure, other site: Secondary | ICD-10-CM | POA: Diagnosis not present

## 2023-04-17 ENCOUNTER — Other Ambulatory Visit: Payer: Self-pay | Admitting: Family Medicine

## 2023-04-17 DIAGNOSIS — R109 Unspecified abdominal pain: Secondary | ICD-10-CM

## 2023-04-19 ENCOUNTER — Other Ambulatory Visit: Payer: 59

## 2023-04-22 ENCOUNTER — Ambulatory Visit
Admission: RE | Admit: 2023-04-22 | Discharge: 2023-04-22 | Disposition: A | Discharge status: Home / Self Care | Payer: PPO | Source: Ambulatory Visit | Attending: Family Medicine | Admitting: Family Medicine

## 2023-04-22 DIAGNOSIS — R109 Unspecified abdominal pain: Secondary | ICD-10-CM

## 2023-04-26 ENCOUNTER — Other Ambulatory Visit: Payer: Self-pay | Admitting: Family Medicine

## 2023-04-26 DIAGNOSIS — R935 Abnormal findings on diagnostic imaging of other abdominal regions, including retroperitoneum: Secondary | ICD-10-CM

## 2023-05-01 ENCOUNTER — Other Ambulatory Visit: Payer: Self-pay | Admitting: Medical Genetics

## 2023-05-01 ENCOUNTER — Ambulatory Visit
Admission: RE | Admit: 2023-05-01 | Discharge: 2023-05-01 | Disposition: A | Discharge status: Home / Self Care | Payer: PPO | Source: Ambulatory Visit | Attending: Family Medicine | Admitting: Family Medicine

## 2023-05-01 DIAGNOSIS — R935 Abnormal findings on diagnostic imaging of other abdominal regions, including retroperitoneum: Secondary | ICD-10-CM

## 2023-05-01 MED ORDER — IOPAMIDOL (ISOVUE-300) INJECTION 61%
100.0000 mL | Freq: Once | INTRAVENOUS | Status: AC | PRN
  Administered 2023-05-01: 100 mL via INTRAVENOUS

## 2023-05-06 ENCOUNTER — Other Ambulatory Visit (HOSPITAL_COMMUNITY): Payer: Self-pay

## 2023-05-06 ENCOUNTER — Telehealth: Payer: Self-pay | Admitting: Medical Genetics

## 2023-05-16 NOTE — Telephone Encounter (Signed)
 Encounter opened in error  Newman Nip, BS [place]  [facility] Clinical Research Specialist II Direct Dial: [phone]  Fax: [phone]

## 2023-11-19 DIAGNOSIS — E538 Deficiency of other specified B group vitamins: Secondary | ICD-10-CM | POA: Diagnosis not present

## 2023-11-19 DIAGNOSIS — G479 Sleep disorder, unspecified: Secondary | ICD-10-CM | POA: Diagnosis not present

## 2023-11-19 DIAGNOSIS — Z23 Encounter for immunization: Secondary | ICD-10-CM | POA: Diagnosis not present

## 2023-11-19 DIAGNOSIS — I10 Essential (primary) hypertension: Secondary | ICD-10-CM | POA: Diagnosis not present

## 2023-11-19 DIAGNOSIS — E785 Hyperlipidemia, unspecified: Secondary | ICD-10-CM | POA: Diagnosis not present

## 2023-11-19 DIAGNOSIS — R7303 Prediabetes: Secondary | ICD-10-CM | POA: Diagnosis not present

## 2023-11-19 DIAGNOSIS — Z0000 Encounter for general adult medical examination without abnormal findings: Secondary | ICD-10-CM | POA: Diagnosis not present

## 2023-11-19 DIAGNOSIS — F439 Reaction to severe stress, unspecified: Secondary | ICD-10-CM | POA: Diagnosis not present

## 2023-11-19 DIAGNOSIS — E876 Hypokalemia: Secondary | ICD-10-CM | POA: Diagnosis not present

## 2023-11-19 DIAGNOSIS — E559 Vitamin D deficiency, unspecified: Secondary | ICD-10-CM | POA: Diagnosis not present

## 2023-11-28 DIAGNOSIS — E876 Hypokalemia: Secondary | ICD-10-CM | POA: Diagnosis not present

## 2023-12-16 DIAGNOSIS — H43811 Vitreous degeneration, right eye: Secondary | ICD-10-CM | POA: Diagnosis not present

## 2023-12-16 DIAGNOSIS — H5213 Myopia, bilateral: Secondary | ICD-10-CM | POA: Diagnosis not present

## 2023-12-16 DIAGNOSIS — H2513 Age-related nuclear cataract, bilateral: Secondary | ICD-10-CM | POA: Diagnosis not present

## 2023-12-24 DIAGNOSIS — Z1231 Encounter for screening mammogram for malignant neoplasm of breast: Secondary | ICD-10-CM | POA: Diagnosis not present
